# Patient Record
Sex: Female | Born: 1955 | Race: Black or African American | Hispanic: No | Smoking: Never smoker
Health system: Southern US, Community
[De-identification: ages and names within clinical notes are randomized; demographics above are authoritative.]

## PROBLEM LIST (undated history)

## (undated) DIAGNOSIS — E785 Hyperlipidemia, unspecified: Secondary | ICD-10-CM

## (undated) DIAGNOSIS — T7840XA Allergy, unspecified, initial encounter: Secondary | ICD-10-CM

## (undated) DIAGNOSIS — E538 Deficiency of other specified B group vitamins: Secondary | ICD-10-CM

## (undated) DIAGNOSIS — E119 Type 2 diabetes mellitus without complications: Secondary | ICD-10-CM

## (undated) DIAGNOSIS — I10 Essential (primary) hypertension: Secondary | ICD-10-CM

## (undated) HISTORY — DX: Essential (primary) hypertension: I10

## (undated) HISTORY — DX: Deficiency of other specified B group vitamins: E53.8

## (undated) HISTORY — DX: Hyperlipidemia, unspecified: E78.5

## (undated) HISTORY — DX: Type 2 diabetes mellitus without complications: E11.9

## (undated) HISTORY — DX: Allergy, unspecified, initial encounter: T78.40XA

---

## 1983-01-30 HISTORY — PX: FOOT SURGERY: SHX648

## 2004-03-06 ENCOUNTER — Emergency Department: Payer: Self-pay | Admitting: General Practice

## 2004-03-07 ENCOUNTER — Emergency Department: Payer: Self-pay | Admitting: General Practice

## 2004-03-08 ENCOUNTER — Inpatient Hospital Stay: Payer: Self-pay | Admitting: Surgery

## 2006-03-01 ENCOUNTER — Emergency Department: Payer: Self-pay | Admitting: Emergency Medicine

## 2011-08-22 ENCOUNTER — Other Ambulatory Visit: Payer: Self-pay | Admitting: General Practice

## 2011-08-22 LAB — BASIC METABOLIC PANEL
Calcium, Total: 9.4 mg/dL (ref 8.5–10.1)
Chloride: 105 mmol/L (ref 98–107)
Co2: 28 mmol/L (ref 21–32)
EGFR (Non-African Amer.): 54 — ABNORMAL LOW
Osmolality: 283 (ref 275–301)
Potassium: 3.7 mmol/L (ref 3.5–5.1)
Sodium: 141 mmol/L (ref 136–145)

## 2012-07-25 ENCOUNTER — Other Ambulatory Visit: Payer: Self-pay | Admitting: Physician Assistant

## 2012-07-25 LAB — COMPREHENSIVE METABOLIC PANEL
Alkaline Phosphatase: 62 U/L (ref 50–136)
Anion Gap: 4 — ABNORMAL LOW (ref 7–16)
BUN: 19 mg/dL — ABNORMAL HIGH (ref 7–18)
Calcium, Total: 9.2 mg/dL (ref 8.5–10.1)
Chloride: 103 mmol/L (ref 98–107)
Creatinine: 1.03 mg/dL (ref 0.60–1.30)
EGFR (Non-African Amer.): >60
Osmolality: 281 (ref 275–301)
SGPT (ALT): 18 U/L (ref 12–78)
Sodium: 139 mmol/L (ref 136–145)

## 2012-07-25 LAB — LIPID PANEL
HDL Cholesterol: 43 mg/dL (ref 40–60)
Triglycerides: 90 mg/dL (ref 0–200)
VLDL Cholesterol, Calc: 18 mg/dL (ref 5–40)

## 2012-08-22 ENCOUNTER — Ambulatory Visit: Payer: Self-pay | Admitting: Physician Assistant

## 2012-08-29 ENCOUNTER — Ambulatory Visit: Payer: Self-pay | Admitting: Physician Assistant

## 2012-09-29 ENCOUNTER — Ambulatory Visit: Payer: Self-pay | Admitting: Physician Assistant

## 2012-12-12 ENCOUNTER — Other Ambulatory Visit: Payer: Self-pay | Admitting: Family Medicine

## 2012-12-12 LAB — BASIC METABOLIC PANEL
Anion Gap: 1 — ABNORMAL LOW (ref 7–16)
Calcium, Total: 9.4 mg/dL (ref 8.5–10.1)
Chloride: 104 mmol/L (ref 98–107)
EGFR (African American): >60
EGFR (Non-African Amer.): 58 — ABNORMAL LOW
Glucose: 101 mg/dL — ABNORMAL HIGH (ref 65–99)
Osmolality: 276 (ref 275–301)
Potassium: 3.6 mmol/L (ref 3.5–5.1)

## 2012-12-12 LAB — LIPID PANEL
Cholesterol: 175 mg/dL (ref 0–200)
HDL Cholesterol: 40 mg/dL (ref 40–60)
Triglycerides: 104 mg/dL (ref 0–200)
VLDL Cholesterol, Calc: 21 mg/dL (ref 5–40)

## 2012-12-12 LAB — TSH: Thyroid Stimulating Horm: 1.5 u[IU]/mL

## 2012-12-16 ENCOUNTER — Ambulatory Visit: Payer: Self-pay | Admitting: Family Medicine

## 2013-02-02 ENCOUNTER — Ambulatory Visit: Payer: Self-pay | Admitting: Gastroenterology

## 2013-10-28 DIAGNOSIS — I10 Essential (primary) hypertension: Secondary | ICD-10-CM | POA: Insufficient documentation

## 2013-12-02 DIAGNOSIS — D519 Vitamin B12 deficiency anemia, unspecified: Secondary | ICD-10-CM | POA: Insufficient documentation

## 2014-03-04 ENCOUNTER — Ambulatory Visit: Payer: Self-pay | Admitting: Internal Medicine

## 2014-05-03 ENCOUNTER — Other Ambulatory Visit: Payer: Self-pay

## 2014-05-03 NOTE — Patient Outreach (Signed)
[facility](THN) Care Management  [facility]  05/03/2014   Elizabeth RombergCynthia C Bates 12-29-1955 [id number]  Subjective: Patient has no complaints. She tells me she is checking sugars most days of the week. Saw Dr. Thedore MinsSingh in December and March- states MD is pleased with her blood sugars. She tells me that the MD said that she can eat bananas for potassium as opposed to taking a potassium pill.   Objective: Patient brought her meter and is checking many days per week but always only fasting.    Current Medications:  Current Outpatient Prescriptions  Medication Sig Dispense Refill  . lisinopril-hydrochlorothiazide (PRINZIDE,ZESTORETIC) 20-12.5 MG per tablet Take 2 tablets by mouth daily.    Marland Kitchen. lovastatin (MEVACOR) 10 MG tablet Take 10 mg by mouth at bedtime.    . metFORMIN (GLUCOPHAGE) 500 MG tablet Take 500 mg by mouth daily.    . Travoprost, BAK Free, (TRAVATAN) 0.004 % SOLN ophthalmic solution Place 1 drop into both eyes at bedtime.    . vitamin B-12 (CYANOCOBALAMIN) 1000 MCG tablet Take 1,000 mcg by mouth daily.     No current facility-administered medications for this visit.     Fall/Depression Screening: PHQ 2/9 Scores 05/03/2014  PHQ - 2 Score 0    Assessment: BP 130/72, blood sugars less than 110mg /dl- no post prandial blood sugar numbers in her meter.   Plan: I will follow up with Aram Beechamynthia in 3 months- to call before then if she has any questions. Discussed the need for exercise but she was not willing to being/commit to regular exercise.  She only saw the dentist once this past year and that was to have her teeth pulled- I'd like her to see the MD 2x/year for cleaning since she does pay for this health benefit.   Susette RacerJulie Christinea Brizuela, RN, BA, MHA, CDE Diabetes Coordinator Inpatient Diabetes Program  [phone] (Team Pager) [phone] Patrcia Dolly([place] Office) 05/03/2014 10:34 AM

## 2014-08-16 ENCOUNTER — Other Ambulatory Visit: Payer: Self-pay

## 2014-08-16 NOTE — Patient Outreach (Signed)
[facility](THN) Care Management  [facility]  08/16/2014   Donneta RombergCynthia C Kohlmeyer [birth year] [id number]  Subjective:Patient in for her regular follow up- blood sugars ideal and patient reports she has not had any hypoglycemia.   Objective:  Filed Vitals:   08/16/14 1402  BP: 112/52   Blood sugar average 110mg /dl over the past 7 & 14 days, 105mg /dl over the past 30 days.  Checks every 2 days. Weight 164.4lbs.   Current Medications:  Current Outpatient Prescriptions  Medication Sig Dispense Refill  . lisinopril-hydrochlorothiazide (PRINZIDE,ZESTORETIC) 20-12.5 MG per tablet Take 2 tablets by mouth daily.    Marland Kitchen. lovastatin (MEVACOR) 10 MG tablet Take 10 mg by mouth at bedtime.    . metFORMIN (GLUCOPHAGE) 500 MG tablet Take 500 mg by mouth daily.    . Travoprost, BAK Free, (TRAVATAN) 0.004 % SOLN ophthalmic solution Place 1 drop into both eyes at bedtime.    . vitamin B-12 (CYANOCOBALAMIN) 1000 MCG tablet Take 1,000 mcg by mouth daily.     No current facility-administered medications for this visit.    Functional Status:  In your present state of health, do you have any difficulty performing the following activities: 08/16/2014  Hearing? N  Vision? N  Difficulty concentrating or making decisions? N  Walking or climbing stairs? N  Dressing or bathing? N  Doing errands, shopping? N    Fall/Depression Screening: PHQ 2/9 Scores 08/16/2014 05/03/2014  PHQ - 2 Score 0 0    Assessment: Well managed diabetes- engaged with her care.    Plan: To improve overall health patient plans to engage in an exercise program;walking 15 minutes per day, 5 x/week.  F/U with [employer] [facility] in December 2016.    Susette RacerJulie Kwaku Mostafa, RN, BA, MHA, CDE [facility] Diabetes Coordinator- [facility] [employer] Dial:  [phone]  Fax:  [phone] E-mail: [email or web address] [address], [place], [place]  [id number]

## 2014-10-21 DIAGNOSIS — E119 Type 2 diabetes mellitus without complications: Secondary | ICD-10-CM | POA: Insufficient documentation

## 2014-10-21 DIAGNOSIS — E7800 Pure hypercholesterolemia, unspecified: Secondary | ICD-10-CM | POA: Insufficient documentation

## 2014-10-21 DIAGNOSIS — E538 Deficiency of other specified B group vitamins: Secondary | ICD-10-CM | POA: Insufficient documentation

## 2014-11-15 ENCOUNTER — Other Ambulatory Visit: Payer: Self-pay

## 2014-11-15 NOTE — Patient Outreach (Signed)
[facility](THN) Care Management  11/15/2014  Elizabeth RombergCynthia C Bates [birth year] [id number]   Left message for patient.  She will be transferred to the care of our pharmacy team for diabetes management, [facility]. Encouraged to call me if she has questions.   Susette RacerJulie Loistine Eberlin, RN, BA, MHA, CDE [facility] Diabetes Coordinator- [facility] [employer] [employer] Dial:  [phone]  Fax:  [phone] E-mail: [email or web address] [address], [place], [place]  [id number]

## 2014-11-15 NOTE — Patient Outreach (Signed)
[facility](THN) Care Management  11/15/2014  Donneta RombergCynthia C Pooley 01-Jan-1956 [id number]   Aram BeechamCynthia called enquiring about her next visit.      Susette RacerJulie Sinead Hockman, RN, BA, MHA, CDE [facility] Diabetes Coordinator- [facility] [employer] Direct Dial:  [phone]  Fax:  [phone] E-mail: [email or web address] [address], [place], [place]  [id number]

## 2014-11-24 ENCOUNTER — Telehealth: Payer: Self-pay

## 2015-01-10 ENCOUNTER — Other Ambulatory Visit: Payer: Self-pay | Admitting: Pharmacist

## 2015-01-10 ENCOUNTER — Encounter: Payer: Self-pay | Admitting: Pharmacist

## 2015-01-10 NOTE — Patient Outreach (Signed)
[facility]) Care Management  [facility] CM Pharmacy   01/10/2015  Elizabeth Bates [birth year] [id number]  Subjective: Patient presents today for 3 month diabetes follow-up as part of the employer-sponsored [employer] [facility] Well[employer] program.  Current diabetes regimen includes metformin  daily.  Patient also continues on daily ACE Inhibitor and statin.  Patient reports compliance with her medications.  Most recent MD follow-up was in September 2016.   No med changes or major health changes at this time.  Patient reports she is not currently exercising or following a diet right now, but she is monitoring her portion sizes.  Her most recent A1c was 6.6% (04/14/14).  Patient has a pending appt for 01/17/15.  Patient is currently monitoring blood glucose 2 to 3 times weekly with 14-day blood glucose average of /dL per her glucometer.  Patient denies hypoglycemia.    Objective:  Blood pressure: 133/84   Current Medications: Current Outpatient Prescriptions  Medication Sig Dispense Refill  . lisinopril-hydrochlorothiazide (PRINZIDE,ZESTORETIC) 20-12.5 MG per tablet Take 2 tablets by mouth daily.    Marland Kitchen lovastatin (MEVACOR) 10 MG tablet Take 10 mg by mouth at bedtime.    . metFORMIN (GLUCOPHAGE) 500 MG tablet Take 500 mg by mouth daily.    . Travoprost, BAK Free, (TRAVATAN) 0.004 % SOLN ophthalmic solution Place 1 drop into both eyes at bedtime.    . vitamin B-12 (CYANOCOBALAMIN) 1000 MCG tablet Take 1,000 mcg by mouth daily.     No current facility-administered medications for this visit.    Functional Status: In your present state of health, do you have any difficulty performing the following activities: 08/16/2014  Hearing? N  Vision? N  Difficulty concentrating or making decisions? N  Walking or climbing stairs? N  Dressing or bathing? N  Doing errands, shopping? N    Fall/Depression Screening: PHQ 2/9 Scores 08/16/2014 05/03/2014  PHQ - 2 Score 0 0    Assessment: 1.   Diabetes: Most recent A1C was 6.6% which is at goal of less than 7%.  Patient denies hypoglycemic events and is able to verbalize appropriate hypoglycemia management plan.  Patient appears motivated to increase exercise at this time.    Patient's estimated 10-year ASCVD risk is 13.9%.  Patient is currently on low-intensity statin therapy.  Per 2013 ACC/AHA cholesterol guidelines, high intensity statin therapy is indicated.  Aspirin therapy also indicated per 2016 ADA diabetes guidelines due to ASCVD >10%.    2.  Hypertension:  Blood pressure at goal of less than 140/90.    Plan: 1.  Patient will continue to take her medications as prescribed and will continue to monitor blood glucose 2 to 3 times per week.  2.  She will increase exercise to 15 minutes per day three days a week.  3.  Will send Dr. Thedore Mins a fax regarding recommendation for high intensity statin therapy and aspirin.   4.  Pharmacy follow up scheduled for 04/11/15 at 10:00 AM.    [facility] CM Care Plan Problem One        Most Recent Value   Care Plan Problem One  Diabetes Management   Role Documenting the Problem One  Clinical Pharmacist   Care Plan for Problem One  Active   THN Long Term Goal (31-90 days)  Patient will work to start walking three days per week for 15 minutes per day in the next 90 days per patient report. t   THN Long Term Goal Start Date  01/10/15   Interventions for  Problem One Long Term Goal  Discussed importance of exercise in diabetes management.  Encouraged patient to begin exercising and work toward 150 minutes per week.  Patient will start by walking 3 days per week for 15 minutes per day.       Lilla Shookachel Henderson, Pharm.D. Pharmacy Resident [facility] [phone]

## 2015-03-02 DIAGNOSIS — H401132 Primary open-angle glaucoma, bilateral, moderate stage: Secondary | ICD-10-CM | POA: Diagnosis not present

## 2015-04-11 ENCOUNTER — Encounter: Payer: Self-pay | Admitting: Pharmacist

## 2015-04-11 ENCOUNTER — Other Ambulatory Visit: Payer: Self-pay | Admitting: Pharmacist

## 2015-04-11 NOTE — Patient Outreach (Signed)
[facility](THN) Care Management  [facility]   04/11/2015  Donneta RombergCynthia C Kuehnel Oct 25, 1955 [id number]  Subjective: Patient presents today for 3 month diabetes follow-up as part of the employer-sponsored Link to Wellness program.  Current diabetes regimen includes metformin 500 mg daily.  Patient also continues on daily ACE Inhibitor and statin.  Patient reports adherence with her medications.  Most recent MD follow-up was 01/17/15.  Patient has a pending appt for 04/18/15.  No med changes or major health changes at this time.  Patient reports she is not currently exercising or following a diet right now.  Her most recent A1c was 6.8% (01/17/15).  Patient is currently monitoring her blood glucose every other day with 7-day average of 112 mg/d, 14-day average of 114 mg/dL, and 40-JWJ30-day average of 121 mg/dL.  Patient denies hypoglycemia.    Objective:  Blood pressure = 125/71  Current Medications: Current Outpatient Prescriptions  Medication Sig Dispense Refill  . lisinopril-hydrochlorothiazide (PRINZIDE,ZESTORETIC) 20-12.5 MG per tablet Take 2 tablets by mouth daily.    Marland Kitchen. lovastatin (MEVACOR) 10 MG tablet Take 10 mg by mouth at bedtime.    . metFORMIN (GLUCOPHAGE) 500 MG tablet Take 500 mg by mouth daily.    . Travoprost, BAK Free, (TRAVATAN) 0.004 % SOLN ophthalmic solution Place 1 drop into both eyes at bedtime.    . vitamin B-12 (CYANOCOBALAMIN) 1000 MCG tablet Take 1,000 mcg by mouth daily.     No current facility-administered medications for this visit.   Functional Status: In your present state of health, do you have any difficulty performing the following activities: 04/11/2015 08/16/2014  Hearing? N N  Vision? N N  Difficulty concentrating or making decisions? N N  Walking or climbing stairs? N N  Dressing or bathing? N N  Doing errands, shopping? N N   Fall/Depression Screening: PHQ 2/9 Scores 04/11/2015 08/16/2014 05/03/2014  PHQ - 2 Score 0 0 0   Assessment: 1.   Diabetes: Most recent A1C was 6.8% which is at goal of less than 7%. Weight is stable from last visit with me.  Patient denies hypoglycemic events and is able to verbalize appropriate hypoglycemia management plan.  Reinforced importance of diet and exercise in diabetes management.  Provided patient EMMI handouts on "Type 2 Diabetes: Food and Exercise" and "Diabetes Diet - Type 2."    Patient's estimated 10-year ASCVD risk is 12%.  Patient is currently on low-intensity statin therapy.  Per ACC/AHA cholesterol guidelines, high intensity statin therapy is indicated.  Aspirin therapy is also indicated due to ASCVD > 10%; however, patient reports she was instructed not to take aspirin due to her anemia.   2.  Hypertension:  Blood pressure at goal of less than 140/90.     Plan: 1.  Patient to review EMMI materials: "Type 2 Diabetes: Food and Exercise" and "Diabetes Diet - Type 2." 2.  Patient set goal to increase exercise to 15 minutes per day three days per week.   3.  Will send quarterly update to patient's PCP.  Will recommend changing lovastatin to high intensity statin therapy such as atorvastatin 40 or 80 mg daily.   4.  Follow up visit scheduled for 07/11/15 at 10:00 AM.    [facility] Problem [facility]        Most Recent Value   Care Plan Problem One  Diabetes Management   Role Documenting the Problem One  Clinical Pharmacist   Care Plan for Problem One  Active  THN Long Term Goal (31-90 days)  Patient will work to start walking three days per week for 15 minutes per day in the next 90 days per patient report. t   THN Long Term Goal Start Date  01/10/15 [renwed goal ]   Interventions for Problem One Long Term Goal  Discussed importance of exercise in diabetes management.  Encouraged patient to begin exercising and work toward 150 minutes per week.  Patient will start by walking 3 days per week for 15 minutes per day.      Lilla Shook, Pharm.D. Pharmacy Resident [facility] [employer] [phone]

## 2015-04-18 ENCOUNTER — Other Ambulatory Visit: Payer: Self-pay | Admitting: Internal Medicine

## 2015-04-18 DIAGNOSIS — E119 Type 2 diabetes mellitus without complications: Secondary | ICD-10-CM | POA: Diagnosis not present

## 2015-04-18 DIAGNOSIS — Z23 Encounter for immunization: Secondary | ICD-10-CM | POA: Diagnosis not present

## 2015-04-18 DIAGNOSIS — Z0000 Encounter for general adult medical examination without abnormal findings: Secondary | ICD-10-CM | POA: Diagnosis not present

## 2015-04-18 DIAGNOSIS — Z1239 Encounter for other screening for malignant neoplasm of breast: Secondary | ICD-10-CM | POA: Diagnosis not present

## 2015-04-18 DIAGNOSIS — Z124 Encounter for screening for malignant neoplasm of cervix: Secondary | ICD-10-CM | POA: Diagnosis not present

## 2015-05-02 ENCOUNTER — Ambulatory Visit
Admission: RE | Admit: 2015-05-02 | Discharge: 2015-05-02 | Disposition: A | Discharge status: Home / Self Care | Payer: 59 | Source: Ambulatory Visit | Attending: Internal Medicine | Admitting: Internal Medicine

## 2015-05-02 ENCOUNTER — Other Ambulatory Visit: Payer: Self-pay | Admitting: Internal Medicine

## 2015-05-02 DIAGNOSIS — Z1239 Encounter for other screening for malignant neoplasm of breast: Secondary | ICD-10-CM

## 2015-05-02 DIAGNOSIS — Z1231 Encounter for screening mammogram for malignant neoplasm of breast: Secondary | ICD-10-CM | POA: Diagnosis not present

## 2015-07-04 DIAGNOSIS — H524 Presbyopia: Secondary | ICD-10-CM | POA: Diagnosis not present

## 2015-07-11 ENCOUNTER — Encounter: Payer: Self-pay | Admitting: Pharmacist

## 2015-07-11 ENCOUNTER — Other Ambulatory Visit: Payer: Self-pay | Admitting: Pharmacist

## 2015-07-11 NOTE — Patient Outreach (Signed)
[facility]) Care Management  [facility] CM Pharmacy   07/11/2015  SHATERA RENNERT [birth year] [id number]  Subjective: Patient presents today for 3 month diabetes follow-up as part of the employer-sponsored Link to Wellness program.  Current diabetes regimen includes metformin 500 mg daily.  Patient also continues on daily ACE Inhibitor and statin.  Patient reports adherence with her medications.    Most recent MD follow-up was 04/18/15.  Patient has a pending appt for 07/25/15.  No med changes or major health changes at this time.  Patient reports she saw her eye doctor on 07/04/15 and reports having a good exam.  She states she has not seen a dentist in the past 6 months.  She reports she had to cancel her appointment in May 2017.    Patient reports she is not currently exercising or following a diet right now.  Patient reports having a shoulder injury due to heavy lifting.  Patient reports pain in her shoulder has limited exercise.  Patient reports the pain in her shoulder improved after taking meloxicam for pain (prescribed by PCP), using Aspercreme, and resting.  Patient reports she is no longer having to take meloxicam.  She states she is working on watching what she eats.  She reports she eats less during the summer when it is hot outside.    Her most recent A1c was 6.8% on 01/17/15.  Patient is currently monitoring her blood glucose daily with blood glucose of 113 mg/dL today.  Per review of patient's meter, most blood glucose readings are in the low 100s.  Patient denies hypoglycemia.    Objective:  Blood pressure = 109/72 Weight = 158.5 lb  Encounter Medications: Outpatient Encounter Prescriptions as of 07/11/2015  Medication Sig  . lisinopril-hydrochlorothiazide (PRINZIDE,ZESTORETIC) 20-12.5 MG per tablet Take 2 tablets by mouth daily.  Marland Kitchen lovastatin (MEVACOR) 10 MG tablet Take 10 mg by mouth at bedtime.  . metFORMIN (GLUCOPHAGE) 500 MG tablet Take 500 mg by mouth daily.  .  Travoprost, BAK Free, (TRAVATAN) 0.004 % SOLN ophthalmic solution Place 1 drop into both eyes at bedtime.  . vitamin B-12 (CYANOCOBALAMIN) 1000 MCG tablet Take 1,000 mcg by mouth daily.   No facility-administered encounter medications on file as of 07/11/2015.   Functional Status: In your present state of health, do you have any difficulty performing the following activities: 04/11/2015 08/16/2014  Hearing? N N  Vision? N N  Difficulty concentrating or making decisions? N N  Walking or climbing stairs? N N  Dressing or bathing? N N  Doing errands, shopping? N N   Fall/Depression Screening: PHQ 2/9 Scores 04/11/2015 08/16/2014 05/03/2014  PHQ - 2 Score 0 0 0    Assessment: 1.  Diabetes:  Most recent A1c was 6.8% which is at goal of less than 7%.  Weight is decreased from last visit with me.  Patient denies hypoglycemic events and is able to verbalize appropriate hypoglycemia management plan.  Patient is not currently exercising, but is interested in increasing activity.    2.  Hypertension:  Blood pressure at goal of less than 140/90.    Plan: 1.  Patient set goal to increase exercise to 15 minutes per day for three days per week.   2.  Patient set goal to see a dentist in the next 3 months.   3.  Follow up visit scheduled for 10/17/15.     [facility] CM Care Plan Problem One        Most Recent Value  Care Plan Problem One  Diabetes Management   Role Documenting the Problem One  Clinical Pharmacist   Care Plan for Problem One  Active   [facility]N Long Term Goal (31-90 days)  Patient will work to start walking three days per week for 15 minutes per day in the next 90 days per patient report. t   THN Long Term Goal Start Date  07/11/15 [renewed goal]   Interventions for Problem One Long Term Goal  Patient reports she has not been exercising.  States she had shoulder injury and exercise was limited due to pain from shoulder injury.  Patient reports she wants to continue to work to increase exercise.   Again reviewed the importance of exercise in diabetes management.  Encouraged patient to begin exercising and work toward 150 minutes per week.  Provided patient handouts at last visit about "Type 2 Diabetes: Food and Exercise."  Discussed handouts during this visit.  Patient reports she read handouts and denied any questions.      [facility] Problem Two        Most Recent Value   Care Plan Problem Two  Wellness   Role Documenting the Problem Two  Clinical Pharmacist   Care Plan for Problem Two  Active   Interventions for Problem Two Long Term Goal   Patient has not seen a dentist in the past 6 months.  Patient reports she had an appointment scheduled for May 2017,  however, she had to cancel appointment.  Discussed importance of seeing a dentist every 6 months.     THN Long Term Goal (31-90) days  Patient will schedule a dentist appointment within the next 3 months per patient report.    THN Long Term Goal Start Date  07/11/15     Lilla Shookachel Sky Primo, Pharm.D. Pharmacy Resident [facility] [phone]

## 2015-07-16 ENCOUNTER — Encounter: Payer: Self-pay | Admitting: Emergency Medicine

## 2015-07-16 ENCOUNTER — Emergency Department: Payer: PRIVATE HEALTH INSURANCE

## 2015-07-16 ENCOUNTER — Emergency Department
Admission: EM | Admit: 2015-07-16 | Discharge: 2015-07-16 | Disposition: A | Discharge status: Home / Self Care | Payer: PRIVATE HEALTH INSURANCE | Attending: Emergency Medicine | Admitting: Emergency Medicine

## 2015-07-16 DIAGNOSIS — E785 Hyperlipidemia, unspecified: Secondary | ICD-10-CM | POA: Diagnosis not present

## 2015-07-16 DIAGNOSIS — M19012 Primary osteoarthritis, left shoulder: Secondary | ICD-10-CM | POA: Diagnosis not present

## 2015-07-16 DIAGNOSIS — Y939 Activity, unspecified: Secondary | ICD-10-CM | POA: Diagnosis not present

## 2015-07-16 DIAGNOSIS — Y999 Unspecified external cause status: Secondary | ICD-10-CM | POA: Diagnosis not present

## 2015-07-16 DIAGNOSIS — S4992XA Unspecified injury of left shoulder and upper arm, initial encounter: Secondary | ICD-10-CM | POA: Diagnosis present

## 2015-07-16 DIAGNOSIS — W010XXA Fall on same level from slipping, tripping and stumbling without subsequent striking against object, initial encounter: Secondary | ICD-10-CM | POA: Insufficient documentation

## 2015-07-16 DIAGNOSIS — E119 Type 2 diabetes mellitus without complications: Secondary | ICD-10-CM | POA: Diagnosis not present

## 2015-07-16 DIAGNOSIS — Y929 Unspecified place or not applicable: Secondary | ICD-10-CM | POA: Diagnosis not present

## 2015-07-16 DIAGNOSIS — Z7984 Long term (current) use of oral hypoglycemic drugs: Secondary | ICD-10-CM | POA: Diagnosis not present

## 2015-07-16 DIAGNOSIS — S40022A Contusion of left upper arm, initial encounter: Secondary | ICD-10-CM | POA: Insufficient documentation

## 2015-07-16 DIAGNOSIS — I10 Essential (primary) hypertension: Secondary | ICD-10-CM | POA: Insufficient documentation

## 2015-07-16 MED ORDER — NAPROXEN 500 MG PO TABS: 500.0000 mg | ORAL_TABLET | Freq: Two times a day (BID) | ORAL | Status: DC

## 2015-07-16 NOTE — Discharge Instructions (Signed)
Contusion °A contusion is a deep bruise. Contusions happen when an injury causes bleeding under the skin. Symptoms of bruising include pain, swelling, and discolored skin. The skin may turn blue, purple, or yellow. °HOME CARE  °· Rest the injured area. °· If told, put ice on the injured area. °· Put ice in a plastic bag. °· Place a towel between your skin and the bag. °· Leave the ice on for 20 minutes, 2-3 times per day. °· If told, put light pressure (compression) on the injured area using an elastic bandage. Make sure the bandage is not too tight. Remove it and put it back on as told by your doctor. °· If possible, raise (elevate) the injured area above the level of your heart while you are sitting or lying down. °· Take over-the-counter and prescription medicines only as told by your doctor. °GET HELP IF: °· Your symptoms do not get better after several days of treatment. °· Your symptoms get worse. °· You have trouble moving the injured area. °GET HELP RIGHT AWAY IF:  °· You have very bad pain. °· You have a loss of feeling (numbness) in a hand or foot. °· Your hand or foot turns pale or cold. °  °This information is not intended to replace advice given to you by your health care provider. Make sure you discuss any questions you have with your health care provider. °  °Document Released: 07/04/2007 Document Revised: 10/06/2014 Document Reviewed: 06/02/2014 °Elsevier Interactive Patient Education ©2016 [employer] Inc[employer] ° °Cryotherapy °Cryotherapy is when you put ice on your injury. Ice helps lessen pain and puffiness (swelling) after an injury. Ice works the best when you start using it in the first 24 to 48 hours after an injury. °HOME CARE °· Put a dry or damp towel between the ice pack and your skin. °· You may press gently on the ice pack. °· Leave the ice on for no more than 10 to 20 minutes at a time. °· Check your skin after 5 minutes to make sure your skin is okay. °· Rest at least 20 minutes between ice  pack uses. °· Stop using ice when your skin loses feeling (numbness). °· Do not use ice on someone who cannot tell you when it hurts. This includes small children and people with memory problems (dementia). °GET HELP RIGHT AWAY IF: °· You have white spots on your skin. °· Your skin turns blue or pale. °· Your skin feels waxy or hard. °· Your puffiness gets worse. °MAKE SURE YOU:  °· Understand these instructions. °· Will watch your condition. °· Will get help right away if you are not doing well or get worse. °  °This information is not intended to replace advice given to you by your health care provider. Make sure you discuss any questions you have with your health care provider. °  °Document Released: 07/04/2007 Document Revised: 04/09/2011 Document Reviewed: 09/07/2010 °Elsevier Interactive Patient Education ©2016 Elsevier Inc. ° °

## 2015-07-16 NOTE — ED Provider Notes (Signed)
[facility] Emergency Department Provider Note  ____________________________________________  Time seen: Approximately 2:02 PM  I have reviewed the triage vital signs and the nursing notes.   HISTORY  Chief Complaint Shoulder Pain    HPI Elizabeth Bates is a 60 y.o. female , NAD, presents to the emergency department accompanied by her husband who assists with history. Patient states she's tripped on a carpet and fell on her left upper arm and shoulder. Has pain about the left upper arm and shoulder since the incident. Denies any numbness, weakness, tingling. No previous traumas to this area of her body. Denies any head injury, LOC, dizziness.   Past Medical History  Diagnosis Date  . Allergy   . Diabetes mellitus without complication (HCC)   . Hypertension   . Hyperlipidemia   . Vitamin B12 deficiency     There are no active problems to display for this patient.   Past Surgical History  Procedure Laterality Date  . 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 lisinopril-hydrochlorothiazide (PRINZIDE,ZESTORETIC) 20-12.5 MG per tablet   Oral   Take 2 tablets by mouth daily.         Marland Kitchen lovastatin (MEVACOR) 10 MG tablet   Oral   Take 10 mg by mouth at bedtime.         . metFORMIN (GLUCOPHAGE) 500 MG tablet   Oral   Take 500 mg by mouth daily.         . naproxen (NAPROSYN) 500 MG tablet   Oral   Take 1 tablet (500 mg total) by mouth 2 (two) times daily with a meal.   14 tablet   0   . Travoprost, BAK Free, (TRAVATAN) 0.004 % SOLN ophthalmic solution   Both Eyes   Place 1 drop into both eyes at bedtime.         . vitamin B-12 (CYANOCOBALAMIN) 1000 MCG tablet   Oral   Take 1,000 mcg by mouth daily.           Allergies Review of patient's allergies indicates no known allergies.  Family History  Problem Relation Age of Onset  .  Cancer Mother   . Hypertension Mother   . Cancer Father   . Hypertension Father   . Cancer Maternal Aunt   . Diabetes Maternal Aunt   . Heart disease Maternal Aunt   . Breast cancer Maternal Aunt   . Cancer Maternal Uncle     Social History Social History  Substance Use Topics  . Smoking status: Never Smoker   . Smokeless tobacco: Never Used  . Alcohol Use: No     Review of Systems  Constitutional: No fever/chills Eyes: No visual changes.  Cardiovascular: No chest pain. Respiratory:  No shortness of breath.  Musculoskeletal: Positive left shoulder and upper arm pain. Negative for back, neck pain.  Skin: Negative for rash, bruising, swelling, redness, open wounds or lacerations. Neurological: Negative for headaches, focal weakness or numbness. No tingling 10-point ROS otherwise negative.  ____________________________________________   PHYSICAL EXAM:  VITAL SIGNS: ED Triage Vitals  Enc Vitals Group     BP 07/16/15 1242 128/64 mmHg     Pulse Rate 07/16/15 1242 79     Resp 07/16/15 1242 18     Temp 07/16/15 1242 97.7 F (36.5 C)     Temp Source  07/16/15 1242 Oral     SpO2 07/16/15 1242 100 %     Weight 07/16/15 1242 158 lb (71.668 kg)     Height 07/16/15 1242 5\' 1"  (1.549 m)     Head Cir --      Peak Flow --      Pain Score 07/16/15 1243 10     Pain Loc --      Pain Edu? --      Excl. in GC? --      Constitutional: Alert and oriented. Well appearing and in no acute distress. Eyes: Conjunctivae are normal.  Head: Atraumatic. Neck: No cervical spine tenderness to palpation. Supple with full range of motion. No trapezial muscle spasms appreciated. Hematological/Lymphatic/Immunilogical: No cervical lymphadenopathy. Cardiovascular:   Good peripheral circulation with 2+ pulses noted in bilateral upper extremities. Capillary refill is brisk in the left upper extremity. Respiratory: Normal respiratory effort without tachypnea or retractions.  Musculoskeletal: Mild  tenderness to palpation about the left deltoid region. No glenohumeral tenderness to palpation. No AC joint tenderness to palpation. Patient can extend at the left shoulder to approximately 80 and stops due to pain in the upper arm. Full range of motion of the bilateral elbows, wrists, hands, fingers without pain.  Neurologic:  Normal speech and language. No gross focal neurologic deficits are appreciated. Sensation to light touch about the left upper extremity is grossly normal. Skin:  Skin is warm, dry and intact. No rash, redness, swelling, bruising, open wounds or lacerations noted about the neck, shoulders, upper back, left upper extremity. Psychiatric: Mood and affect are normal. Speech and behavior are normal. Patient exhibits appropriate insight and judgement.   ____________________________________________   LABS  None ____________________________________________  EKG  None ____________________________________________  RADIOLOGY I have personally viewed and evaluated these images (plain radiographs) as part of my medical decision making, as well as reviewing the written report by the radiologist.  Dg Shoulder Left  07/16/2015  CLINICAL DATA:  60 year old female with a history of left shoulder injury. EXAM: LEFT SHOULDER - 2+ VIEW COMPARISON:  None. FINDINGS: No acute fracture. Glenohumeral joint appears congruent. Degenerative changes at the glenohumeral joint, greater tuberosity, and acromioclavicular joint. Subacromial spurring. No rib fracture identified. IMPRESSION: Negative for acute bony abnormality. Degenerative changes as above. Signed, Yvone Neu. Loreta Ave, DO [facility] Radiology Electronically Signed   By: Gilmer Mor D.O.   On: 07/16/2015 13:27    ____________________________________________    PROCEDURES  Procedure(s) performed: None   Medications - No data to  display   ____________________________________________   INITIAL IMPRESSION / ASSESSMENT AND PLAN / ED COURSE  Pertinent imaging results that were available during my care of the patient were reviewed by me and considered in my medical decision making (see chart for details).  Patient's diagnosis is consistent with contusion of left upper arm. Patient will be discharged home with prescriptions for naproxen to take as directed. Patient may also take Tylenol alternating with the prescribed naproxen as needed. Advised patient to apply ice to the affected area 20 minutes 3-4 times daily as needed. Encouraged range of motion exercises to improve extension of the left upper extremity. Patient is to follow up with Dr. Joice Lofts in orthopedics or other orthopedic specialist as deemed appropriate by Work[employer] Compensation if symptoms persist past this treatment course. Patient is given ED precautions to return to the ED for any worsening or new symptoms.    ____________________________________________  FINAL CLINICAL IMPRESSION(S) / ED DIAGNOSES  Final  diagnoses:  Contusion of left upper arm, initial encounter      NEW MEDICATIONS STARTED DURING THIS VISIT:  Discharge Medication List as of 07/16/2015  2:07 PM    START taking these medications   Details  naproxen (NAPROSYN) 500 MG tablet Take 1 tablet (500 mg total) by mouth 2 (two) times daily with a meal., Starting 07/16/2015, Until Discontinued, Print             Hope PigeonJami L Hagler, PA-C 07/16/15 1422  Sharyn CreamerMark Quale, MD 07/16/15 1525

## 2015-07-16 NOTE — ED Notes (Signed)
Slipped and fell on mat at work just prior to arrival, landed on L arm, pain upper arm and shoulder.

## 2015-07-18 DIAGNOSIS — E119 Type 2 diabetes mellitus without complications: Secondary | ICD-10-CM | POA: Diagnosis not present

## 2015-07-25 DIAGNOSIS — E7800 Pure hypercholesterolemia, unspecified: Secondary | ICD-10-CM | POA: Diagnosis not present

## 2015-07-25 DIAGNOSIS — I10 Essential (primary) hypertension: Secondary | ICD-10-CM | POA: Diagnosis not present

## 2015-07-25 DIAGNOSIS — M25512 Pain in left shoulder: Secondary | ICD-10-CM | POA: Diagnosis not present

## 2015-07-25 DIAGNOSIS — E119 Type 2 diabetes mellitus without complications: Secondary | ICD-10-CM | POA: Diagnosis not present

## 2015-09-01 ENCOUNTER — Encounter: Payer: Self-pay | Admitting: Urgent Care

## 2015-09-01 ENCOUNTER — Emergency Department
Admission: EM | Admit: 2015-09-01 | Discharge: 2015-09-02 | Disposition: A | Discharge status: Home / Self Care | Payer: 59 | Attending: Emergency Medicine | Admitting: Emergency Medicine

## 2015-09-01 ENCOUNTER — Emergency Department: Payer: 59

## 2015-09-01 DIAGNOSIS — Z7984 Long term (current) use of oral hypoglycemic drugs: Secondary | ICD-10-CM | POA: Insufficient documentation

## 2015-09-01 DIAGNOSIS — Z79899 Other long term (current) drug therapy: Secondary | ICD-10-CM | POA: Insufficient documentation

## 2015-09-01 DIAGNOSIS — I10 Essential (primary) hypertension: Secondary | ICD-10-CM | POA: Diagnosis not present

## 2015-09-01 DIAGNOSIS — E119 Type 2 diabetes mellitus without complications: Secondary | ICD-10-CM | POA: Insufficient documentation

## 2015-09-01 DIAGNOSIS — M7989 Other specified soft tissue disorders: Secondary | ICD-10-CM | POA: Diagnosis not present

## 2015-09-01 DIAGNOSIS — M79672 Pain in left foot: Secondary | ICD-10-CM | POA: Diagnosis not present

## 2015-09-01 NOTE — ED Triage Notes (Signed)
Patient presents with c/o LEFT foot pain and swelling x 3 weeks. 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

## 2015-09-02 DIAGNOSIS — Z79899 Other long term (current) drug therapy: Secondary | ICD-10-CM | POA: Diagnosis not present

## 2015-09-02 DIAGNOSIS — Z7984 Long term (current) use of oral hypoglycemic drugs: Secondary | ICD-10-CM | POA: Diagnosis not present

## 2015-09-02 DIAGNOSIS — M79672 Pain in left foot: Secondary | ICD-10-CM | POA: Diagnosis not present

## 2015-09-02 DIAGNOSIS — I10 Essential (primary) hypertension: Secondary | ICD-10-CM | POA: Diagnosis not present

## 2015-09-02 DIAGNOSIS — E119 Type 2 diabetes mellitus without complications: Secondary | ICD-10-CM | POA: Diagnosis not present

## 2015-09-02 MED ORDER — OXYCODONE-ACETAMINOPHEN 5-325 MG PO TABS: 1.0000 | ORAL_TABLET | ORAL | 0 refills | Status: DC | PRN

## 2015-09-02 MED ORDER — OXYCODONE-ACETAMINOPHEN 5-325 MG PO TABS
1.0000 | ORAL_TABLET | Freq: Once | ORAL | Status: AC
  Administered 2015-09-02: 1 via ORAL
  Filled 2015-09-02: qty 1

## 2015-09-02 MED ORDER — IBUPROFEN 800 MG PO TABS
800.0000 mg | ORAL_TABLET | Freq: Once | ORAL | Status: AC
  Administered 2015-09-02: 800 mg via ORAL
  Filled 2015-09-02: qty 1

## 2015-09-02 MED ORDER — IBUPROFEN 800 MG PO TABS: 800.0000 mg | ORAL_TABLET | Freq: Three times a day (TID) | ORAL | 0 refills | Status: DC | PRN

## 2015-09-02 NOTE — ED Provider Notes (Signed)
[facility] Emergency Department Provider Note   ____________________________________________   First MD Initiated Contact with Patient 09/02/15 0005     (approximate)  I have reviewed the triage vital signs and the nursing notes.   HISTORY  Chief Complaint Foot Pain    HPI Elizabeth Bates is a 60 y.o. female who presents to the ED from home with a chief complaint of left foot pain. Patient reports nontraumatic left foot pain 3 weeks. History of surgery on the same foot.States she stands on concrete floor for prolonged periods of time at her job. Swelling is worse when she gets off of work, and better first thing in the morning when she wakes up. Denies recent fever, chills, chest pain, shortness of breath, abdominal pain, nausea, vomiting, diarrhea. Denies recent travel or trauma. Has tried ibuprofen without relief of symptoms. Standing for prolonged periods of time makes her symptoms worse.   Past Medical History:  Diagnosis Date  . Allergy   . Diabetes mellitus without complication (HCC)   . Hyperlipidemia   . Hypertension   . Vitamin B12 deficiency     There are no active problems to display for this patient.   Past Surgical History:  Procedure Laterality Date  . 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 Authorizing Provider  lisinopril-hydrochlorothiazide (PRINZIDE,ZESTORETIC) 20-12.5 MG per tablet Take 2 tablets by mouth daily.    Historical Provider, MD  lovastatin (MEVACOR) 10 MG tablet Take 10 mg by mouth at bedtime.    Historical Provider, MD  metFORMIN (GLUCOPHAGE) 500 MG tablet Take 500 mg by mouth daily.    Historical Provider, MD  naproxen (NAPROSYN) 500 MG tablet Take 1 tablet (500 mg total) by mouth 2 (two) times daily with a meal. 07/16/15   Jami L Hagler, PA-C  Travoprost, BAK Free, (TRAVATAN) 0.004 % SOLN ophthalmic  solution Place 1 drop into both eyes at bedtime.    Historical Provider, MD  vitamin B-12 (CYANOCOBALAMIN) 1000 MCG tablet Take 1,000 mcg by mouth daily.    Historical Provider, MD    Allergies Review of patient's allergies indicates no known allergies.  Family History  Problem Relation Age of Onset  . Cancer Mother   . Hypertension Mother   . Cancer Father   . Hypertension Father   . Cancer Maternal Aunt   . Diabetes Maternal Aunt   . Heart disease Maternal Aunt   . Breast cancer Maternal Aunt   . Cancer Maternal Uncle     Social History Social History  Substance Use Topics  . Smoking status: Never Smoker  . Smokeless tobacco: Never Used  . Alcohol use No    Review of Systems  Constitutional: No fever/chills. Eyes: No visual changes. ENT: No sore throat. Cardiovascular: Denies chest pain. Respiratory: Denies shortness of breath. Gastrointestinal: No abdominal pain.  No nausea, no vomiting.  No diarrhea.  No constipation. Genitourinary: Negative for dysuria. Musculoskeletal: Positive for left foot pain. Negative for back pain. Skin: Negative for rash. Neurological: Negative for headaches, focal weakness or numbness.  10-point ROS otherwise negative.  ____________________________________________   PHYSICAL EXAM:  VITAL SIGNS: ED Triage Vitals  Enc Vitals Group     BP 09/01/15 2304 123/68     Pulse Rate 09/01/15 2304 89     Resp 09/01/15 2304 18     Temp 09/01/15 2304  98.3 F (36.8 C)     Temp Source 09/01/15 2304 Oral     SpO2 09/01/15 2304 97 %     Weight 09/01/15 2304 166 lb (75.3 kg)     Height 09/01/15 2304 5\' 1"  (1.549 m)     Head Circumference --      Peak Flow --      Pain Score 09/01/15 2305 10     Pain Loc --      Pain Edu? --      Excl. in GC? --     Constitutional: Alert and oriented. Well appearing and in no acute distress. Eyes: Conjunctivae are normal. PERRL. EOMI. Head: Atraumatic. Nose: No congestion/rhinnorhea. Mouth/Throat:  Mucous membranes are moist.  Oropharynx non-erythematous. Neck: No stridor.   Cardiovascular: Normal rate, regular rhythm. Grossly normal heart sounds.  Good peripheral circulation. Respiratory: Normal respiratory effort.  No retractions. Lungs CTAB. Gastrointestinal: Soft and nontender. No distention. No abdominal bruits. No CVA tenderness. Musculoskeletal: Left foot tender to palpation over anterior no talar ligament. Foot has mild swelling. Limited range of motion secondary to pain. 2+ distal pulses. Calf is supple without tenderness or swelling. Limb is symmetrically warm without evidence for ischemia. Brisk, less than 5 second capillary refill. Neurologic:  Normal speech and language. No gross focal neurologic deficits are appreciated.  Skin:  Skin is warm, dry and intact. No rash noted. Psychiatric: Mood and affect are normal. Speech and behavior are normal.  ____________________________________________   LABS (all labs ordered are listed, but only abnormal results are displayed)  Labs Reviewed - No data to display ____________________________________________  EKG  None ____________________________________________  RADIOLOGY  Left foot complete (view by me, interpreted per Dr. Gwenyth Bender): No acute fracture or dislocation.  Chronic changes as described. ____________________________________________   PROCEDURES  Procedure(s) performed: None  Procedures  Critical Care performed: No  ____________________________________________   INITIAL IMPRESSION / ASSESSMENT AND PLAN / ED COURSE  Pertinent labs & imaging results that were available during my care of the patient were reviewed by me and considered in my medical decision making (see chart for details).  60 year old female presents with nontraumatic left foot pain and mild swelling for the past 3 weeks. Will treat with NSAID's, analgesia, compression wrap and refer to podiatry for outpatient follow-up. Patient has  cane as well as a walker at home which she will use as needed. Strict return precautions given. Patient and spouse verbalize understanding and agree with plan of care.  Clinical Course     ____________________________________________   FINAL CLINICAL IMPRESSION(S) / ED DIAGNOSES  Final diagnoses:  Foot pain, left      NEW MEDICATIONS STARTED DURING THIS VISIT:  New Prescriptions   No medications on file     Note:  This document was prepared using Dragon voice recognition software and may include unintentional dictation errors.    Irean Hong, MD 09/02/15 [id number]

## 2015-09-02 NOTE — Discharge Instructions (Signed)
1. You may take pain medicines as needed (Motrin/Percocet #15). 2. You may remove Ace wrap to bathe and sleep. 3. You may bear weight on the affected foot as you are comfortable. Until then, please use your cane or walker to assist you. 4. Return to the ER for worsening symptoms, increased swelling, numbness/tingling or other concerns.

## 2015-10-17 ENCOUNTER — Encounter: Payer: Self-pay | Admitting: Pharmacist

## 2015-10-17 ENCOUNTER — Other Ambulatory Visit: Payer: Self-pay | Admitting: Pharmacist

## 2015-10-17 NOTE — Patient Outreach (Addendum)
[facility]) Care Management  [facility]   10/17/2015  NABA SNEED 10/23/1955 [id number]  Subjective: Patient presents today for 3 month diabetes follow-up as part of the employer-sponsored Link to Wellness program.  Current diabetes regimen includes metformin.  Patient also continues on daily aspirin, lisinopril and lovastatin.  Most recent MD follow-up was 07/28/15.  Patient has a pending appt for next week with Dr Thedore Mins.  No med changes or major health changes at this time.   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  Mostly boils or bakes her foods.  She does state she is limiting her serving sizes of carbohydrates when she does eat them.    Patient reported exercise habits: States she started walking then she stopped when her foot started swelling which caused her to have walking difficulties. She states that today her swelling has improved but it varies from day to day and sometimes it swells. She reports that she has a lot of standing at work. 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 She states she is elevating her feet everyday to help with the swelling. She does not like the compression stockings.    Patient denies hypoglycemic events. She did have one reading of 68 mg/dL which she states happened when she didn't eat much that day.  Patient reports nocturia 0-1 time per night.  Patient denies pain/burning upon urination.  Patient denies neuropathy. Patient denies visual changes.  Reports last eye exam ~06/2015.  Reports she has pending dental exam this year. Patient reports self foot exams. Denies changes or problems.   Patient reported self  monitored blood glucose frequency daily Home fasting CBG: Range ~100-140 fasting blood glucose Home CBG 30 day average: 115 mg/dL   Objective:  Hemoglobin A1C 07/18/2015 6.9  Vitals:   10/17/15 1012  BP: 110/73  Pulse: 91    Lipid Panel  Cholesterol, Total 07/18/2015 190  Triglyceride 07/18/2015 130  HDL (High Density Lipoprotein) Cho* 07/18/2015 44.0   LDL (Low Density Lipoprotien), Cal* 07/18/2015 120  VLDL Cholesterol 07/18/2015 26 mg/dL  Cholesterol/HDL Ratio 07/18/2015 4.3   10 year ASCVD risk: 5.8%   Encounter Medications: Outpatient Encounter Prescriptions as of 10/17/2015  Medication Sig  . ibuprofen (ADVIL,MOTRIN) 200 MG tablet Take 400 mg by mouth every 6 (six) hours as needed.  Marland Kitchen lisinopril-hydrochlorothiazide (PRINZIDE,ZESTORETIC) 20-12.5 MG per tablet Take 2 tablets by mouth daily.  Marland Kitchen lovastatin (MEVACOR) 10 MG tablet Take 10 mg by mouth at bedtime.  . metFORMIN (GLUCOPHAGE) 500 MG tablet Take 500 mg by mouth daily.  . Travoprost, BAK Free, (TRAVATAN) 0.004 % SOLN ophthalmic solution Place 1 drop into both eyes at bedtime.  . vitamin B-12 (CYANOCOBALAMIN) 1000 MCG tablet Take 1,000 mcg by mouth daily.  . [DISCONTINUED] ibuprofen (ADVIL,MOTRIN) 800 MG tablet Take 1 tablet (800 mg total) by mouth every 8 (eight) hours as needed for moderate pain.  . [DISCONTINUED] naproxen (NAPROSYN) 500 MG tablet Take 1 tablet (500 mg total) by mouth 2 (two) times daily with a meal.  . [DISCONTINUED] oxyCODONE-acetaminophen (ROXICET) 5-325 MG tablet Take 1 tablet by mouth every 4 (four) hours as needed for severe pain.   No facility-administered encounter medications on file as  of 10/17/2015.     Functional Status: In your present state of health, do you have any difficulty performing the following activities: 10/17/2015 04/11/2015  Hearing? N N  Vision? N N  Difficulty concentrating or making decisions? N N  Walking or climbing stairs? N N  Dressing or bathing? N N  Doing  errands, shopping? N N  Some recent data might be hidden    Fall/Depression Screening: PHQ 2/9 Scores 10/17/2015 04/11/2015 08/16/2014 05/03/2014  PHQ - 2 Score 0 0 0 0     Assessment:  Diabetes: Most recent A1C was 6.9% which is at at goal of less than 7%. Weight is stable from last visit with me.  ASCVD risk 5.8% with LDL of 120 mg/dL on low intensity statin, is a candidate for moderate intensity statin based on LDL goal above 100 mg/dL and ASCVD risk.   Lifestyle improvements: Physical Activity- No current exercise regimen.  Was previously walking but stopped due to foot pain/swelling.  Also limited by arthritis in left shoulder  Nutrition- Not currently following low carbohydrate diet    Plan/Goals for Next Visit: -Counseled on signs/symptoms and treatment of hypoglycemia using the rule of 15s -Discussed importance of low carbohydrate diet and exercise of >150 minutes/week -Provided patient with "Planning Healthy Meals" diabetes handout -Patient made goal to start walking for 15 minutes per day for 3 days per week as tolerated with her foot/shoulder pain. -Discussed strategies to minimize foot swelling such as elevation, ice, and compression stockings while working -Will contact patients primary care Dr Thedore MinsSingh to consider increasing Lovastatin to moderate intensity dose of 40 mg daily.   Next appointment to see me is: 3 months (telephonic)   Hazle NordmannKelsy Kaylib Furness, PharmD [facility] PGY2 Pharmacy Resident [phone]  [facility] Problem One   Flowsheet Row Most Recent Value  Care Plan Problem One  Non-adherence to exercise related to diabetes management as evidenced by patient report  Role Documenting the Problem One  Clinical Pharmacist  Care Plan for Problem One  Active  [facility] Long Term Goal (31-90 days)  Patient will work to start walking three days per week for 15 minutes per day in the next 90 days per patient report. t  [facility] Long Term Goal Start Date  10/17/15 [renewed goal]   Interventions for Problem One Long Term Goal  Discussed importance of exercise in diabetes management.  Encouraged patient to begin exercising and work toward 150 minutes per week.  Patient will start by walking 3 days per week for 15 minutes per day as tolerated with her foot swelling    [facility] Problem Two   Flowsheet Row Most Recent Value  Care Plan Problem Two  Wellness  Role Documenting the Problem Two  Clinical Pharmacist  Care Plan for Problem Two  Active  Interventions for Problem Two Long Term Goal   Patient has not seen a dentist in the past 6 months.  Patient reports she had an appointment scheduled in the next couple months.  Discussed importance of seeing a dentist every 6 months.    [facility] Long Term Goal (31-90) days  Patient will schedule a dentist appointment within the next 3 months per patient report.   [facility] Long Term Goal Start Date  07/11/15 [Renewed]

## 2015-10-31 DIAGNOSIS — E7800 Pure hypercholesterolemia, unspecified: Secondary | ICD-10-CM | POA: Diagnosis not present

## 2015-10-31 DIAGNOSIS — M19012 Primary osteoarthritis, left shoulder: Secondary | ICD-10-CM | POA: Diagnosis not present

## 2015-10-31 DIAGNOSIS — E119 Type 2 diabetes mellitus without complications: Secondary | ICD-10-CM | POA: Diagnosis not present

## 2015-10-31 DIAGNOSIS — I10 Essential (primary) hypertension: Secondary | ICD-10-CM | POA: Diagnosis not present

## 2016-01-09 ENCOUNTER — Other Ambulatory Visit: Payer: Self-pay

## 2016-01-09 ENCOUNTER — Ambulatory Visit: Payer: Self-pay

## 2016-01-09 DIAGNOSIS — M19012 Primary osteoarthritis, left shoulder: Secondary | ICD-10-CM | POA: Diagnosis not present

## 2016-01-09 DIAGNOSIS — I10 Essential (primary) hypertension: Secondary | ICD-10-CM | POA: Diagnosis not present

## 2016-01-09 NOTE — Patient Outreach (Addendum)
[facility]) Care Management  [facility]   01/09/2016  Elizabeth RombergCynthia C Bates January 20, 1956 [id number]  Subjective: Patient was called today for 3 month diabetes follow-up phone call as part of the employer-sponsored Link to Wellness program.  Current diabetes regimen includes metformin 500mg  daily.  Patient also continues on daily  Lisinopril/hctz and lovastatin.  She is not on aspirin due to concerns for bleeding with her history of anemia. Most recent MD follow-up was in Sept 2017.  Patient has a pending appt for diabetes next Monday, unable to state doctor's name.  No med changes or major health changes at this time.   Patient reported dietary habits: Eats 2-3 meals/day Breakfast: eggs, bacon, and toast Lunch: depends on cafeteria food at work, sometimes skips Dinner: meat, vegetable, starch  Snacks: not a snacker Drinks: water, sweet tea, regular soda  Patient reported exercise habits: Patient is walking about every day with dog ~1 mile.   Patient denies hypoglycemic events.  Patient denies nocturia; no different than normal.  Patient denies pain/burning upon urination.  Patient denies neuropathy. Patient denies visual changes. Eye appt next Monday. Patient reports self foot exams.   Patient reported self monitored blood glucose frequency every other day. Home fasting CBG: lowest 100s, highest 140s   Patient reported self monitored blood pressure: not elevated, usually < 140/90  Objective:  No results found for: HGBA1C There were no vitals filed for this visit.  10 year ASCVD risk: < 7.5%   Assessment:  Diabetes: Patient was unable to state most recent A1C, however she is most likely controlled based on her reported blood sugar readings.   Cholesterol- At the last visit, it was recommended to her PCP to increase her lovastatin dose to be a moderate intensity statin. Patient states that her provider did not feel that it was necessary at this time due  to her history of poor medication tolerance with higher doses.   Lifestyle improvements:  Physical Activity- Patient has improved her activity level since the last visit. She has started walking her dog daily for ~1 mile, but is limited by arthritic pain. She has an appointment next Monday about this pain, and is hopeful that she will be more active after receiving an injection for her arthritis at this upcoming appointment.   Nutrition- Patient is aware of an appropriate diabetic diet, however continues to drink tea and sodas with full sugar. She states that she is unable to tolerate diet drinks due to GI upset with fake sugars. We discussed cutting back on these drinks, and she stated that she has already reduced their intake but will continue to watch them.    Plan/Goals for Next Visit: - Follow-up with diabetic provider about obtaining an A1c - Continue to increase activity level to walks that are longer than 1 mile several times a week - Continue to monitor blood sugar, take medications as prescribed, and be aware of good diet choices   Allie BossierApryl Manav Pierotti, PharmD PGY1 Pharmacy Resident [phone] (Pager) 01/09/2016 1:05 PM

## 2016-01-16 ENCOUNTER — Ambulatory Visit: Payer: Self-pay

## 2016-01-16 DIAGNOSIS — H40153 Residual stage of open-angle glaucoma, bilateral: Secondary | ICD-10-CM | POA: Diagnosis not present

## 2016-04-16 DIAGNOSIS — E119 Type 2 diabetes mellitus without complications: Secondary | ICD-10-CM | POA: Diagnosis not present

## 2016-04-16 DIAGNOSIS — E7800 Pure hypercholesterolemia, unspecified: Secondary | ICD-10-CM | POA: Diagnosis not present

## 2016-04-16 DIAGNOSIS — I10 Essential (primary) hypertension: Secondary | ICD-10-CM | POA: Diagnosis not present

## 2016-06-04 DIAGNOSIS — H40153 Residual stage of open-angle glaucoma, bilateral: Secondary | ICD-10-CM | POA: Diagnosis not present

## 2016-06-18 DIAGNOSIS — E7800 Pure hypercholesterolemia, unspecified: Secondary | ICD-10-CM | POA: Diagnosis not present

## 2016-06-18 DIAGNOSIS — E119 Type 2 diabetes mellitus without complications: Secondary | ICD-10-CM | POA: Diagnosis not present

## 2016-06-18 DIAGNOSIS — I10 Essential (primary) hypertension: Secondary | ICD-10-CM | POA: Diagnosis not present

## 2016-07-02 ENCOUNTER — Other Ambulatory Visit: Payer: Self-pay | Admitting: Internal Medicine

## 2016-07-02 DIAGNOSIS — Z124 Encounter for screening for malignant neoplasm of cervix: Secondary | ICD-10-CM | POA: Diagnosis not present

## 2016-07-02 DIAGNOSIS — Z23 Encounter for immunization: Secondary | ICD-10-CM | POA: Diagnosis not present

## 2016-07-02 DIAGNOSIS — Z1231 Encounter for screening mammogram for malignant neoplasm of breast: Secondary | ICD-10-CM | POA: Diagnosis not present

## 2016-07-02 DIAGNOSIS — Z780 Asymptomatic menopausal state: Secondary | ICD-10-CM | POA: Diagnosis not present

## 2016-07-02 DIAGNOSIS — Z0000 Encounter for general adult medical examination without abnormal findings: Secondary | ICD-10-CM | POA: Diagnosis not present

## 2016-07-16 ENCOUNTER — Ambulatory Visit
Admission: RE | Admit: 2016-07-16 | Discharge: 2016-07-16 | Disposition: A | Discharge status: Home / Self Care | Payer: 59 | Source: Ambulatory Visit | Attending: Internal Medicine | Admitting: Internal Medicine

## 2016-07-16 DIAGNOSIS — Z1231 Encounter for screening mammogram for malignant neoplasm of breast: Secondary | ICD-10-CM | POA: Diagnosis not present

## 2016-07-16 DIAGNOSIS — Z13820 Encounter for screening for osteoporosis: Secondary | ICD-10-CM | POA: Diagnosis not present

## 2016-07-16 DIAGNOSIS — Z780 Asymptomatic menopausal state: Secondary | ICD-10-CM | POA: Diagnosis not present

## 2016-09-10 DIAGNOSIS — H524 Presbyopia: Secondary | ICD-10-CM | POA: Diagnosis not present

## 2016-12-24 DIAGNOSIS — E119 Type 2 diabetes mellitus without complications: Secondary | ICD-10-CM | POA: Diagnosis not present

## 2016-12-31 DIAGNOSIS — E119 Type 2 diabetes mellitus without complications: Secondary | ICD-10-CM | POA: Diagnosis not present

## 2016-12-31 DIAGNOSIS — I10 Essential (primary) hypertension: Secondary | ICD-10-CM | POA: Diagnosis not present

## 2016-12-31 DIAGNOSIS — E7800 Pure hypercholesterolemia, unspecified: Secondary | ICD-10-CM | POA: Diagnosis not present

## 2017-01-14 DIAGNOSIS — H40153 Residual stage of open-angle glaucoma, bilateral: Secondary | ICD-10-CM | POA: Diagnosis not present

## 2017-05-06 DIAGNOSIS — H40153 Residual stage of open-angle glaucoma, bilateral: Secondary | ICD-10-CM | POA: Diagnosis not present

## 2017-06-17 DIAGNOSIS — E7800 Pure hypercholesterolemia, unspecified: Secondary | ICD-10-CM | POA: Diagnosis not present

## 2017-06-17 DIAGNOSIS — E119 Type 2 diabetes mellitus without complications: Secondary | ICD-10-CM | POA: Diagnosis not present

## 2017-06-17 DIAGNOSIS — I10 Essential (primary) hypertension: Secondary | ICD-10-CM | POA: Diagnosis not present

## 2017-07-01 DIAGNOSIS — E119 Type 2 diabetes mellitus without complications: Secondary | ICD-10-CM | POA: Diagnosis not present

## 2017-07-01 DIAGNOSIS — R87810 Cervical high risk human papillomavirus (HPV) DNA test positive: Secondary | ICD-10-CM | POA: Diagnosis not present

## 2017-07-01 DIAGNOSIS — Z0000 Encounter for general adult medical examination without abnormal findings: Secondary | ICD-10-CM | POA: Diagnosis not present

## 2017-07-01 DIAGNOSIS — I10 Essential (primary) hypertension: Secondary | ICD-10-CM | POA: Diagnosis not present

## 2017-07-03 ENCOUNTER — Other Ambulatory Visit: Payer: Self-pay | Admitting: Internal Medicine

## 2017-07-03 DIAGNOSIS — Z1239 Encounter for other screening for malignant neoplasm of breast: Secondary | ICD-10-CM

## 2017-08-05 DIAGNOSIS — E119 Type 2 diabetes mellitus without complications: Secondary | ICD-10-CM | POA: Diagnosis not present

## 2017-08-05 DIAGNOSIS — Z1231 Encounter for screening mammogram for malignant neoplasm of breast: Secondary | ICD-10-CM | POA: Diagnosis not present

## 2017-08-05 DIAGNOSIS — I10 Essential (primary) hypertension: Secondary | ICD-10-CM | POA: Diagnosis not present

## 2017-08-05 DIAGNOSIS — E538 Deficiency of other specified B group vitamins: Secondary | ICD-10-CM | POA: Diagnosis not present

## 2017-09-10 DIAGNOSIS — H524 Presbyopia: Secondary | ICD-10-CM | POA: Diagnosis not present

## 2017-09-23 ENCOUNTER — Ambulatory Visit
Admission: RE | Admit: 2017-09-23 | Discharge: 2017-09-23 | Disposition: A | Discharge status: Home / Self Care | Payer: 59 | Source: Ambulatory Visit | Attending: Internal Medicine | Admitting: Internal Medicine

## 2017-09-23 DIAGNOSIS — Z1239 Encounter for other screening for malignant neoplasm of breast: Secondary | ICD-10-CM

## 2017-09-23 DIAGNOSIS — Z1231 Encounter for screening mammogram for malignant neoplasm of breast: Secondary | ICD-10-CM | POA: Diagnosis not present

## 2017-12-02 DIAGNOSIS — E538 Deficiency of other specified B group vitamins: Secondary | ICD-10-CM | POA: Diagnosis not present

## 2017-12-02 DIAGNOSIS — E119 Type 2 diabetes mellitus without complications: Secondary | ICD-10-CM | POA: Diagnosis not present

## 2017-12-09 DIAGNOSIS — E7800 Pure hypercholesterolemia, unspecified: Secondary | ICD-10-CM | POA: Diagnosis not present

## 2017-12-09 DIAGNOSIS — E538 Deficiency of other specified B group vitamins: Secondary | ICD-10-CM | POA: Diagnosis not present

## 2017-12-09 DIAGNOSIS — E119 Type 2 diabetes mellitus without complications: Secondary | ICD-10-CM | POA: Diagnosis not present

## 2017-12-09 DIAGNOSIS — I10 Essential (primary) hypertension: Secondary | ICD-10-CM | POA: Diagnosis not present

## 2018-01-20 DIAGNOSIS — H40153 Residual stage of open-angle glaucoma, bilateral: Secondary | ICD-10-CM | POA: Diagnosis not present

## 2018-05-15 DIAGNOSIS — M7989 Other specified soft tissue disorders: Secondary | ICD-10-CM | POA: Diagnosis not present

## 2018-05-15 DIAGNOSIS — L089 Local infection of the skin and subcutaneous tissue, unspecified: Secondary | ICD-10-CM | POA: Diagnosis not present

## 2018-06-06 ENCOUNTER — Other Ambulatory Visit: Payer: Self-pay | Admitting: *Deleted

## 2018-08-15 ENCOUNTER — Other Ambulatory Visit: Payer: Self-pay | Admitting: Internal Medicine

## 2018-08-15 DIAGNOSIS — Z1231 Encounter for screening mammogram for malignant neoplasm of breast: Secondary | ICD-10-CM

## 2018-11-29 IMAGING — MG MM DIGITAL SCREENING BILAT W/ CAD
4 series · 4 of 4 positions shown · non-contrast
Comparison: Previous exam(s).

CLINICAL DATA: Screening.

EXAM:
DIGITAL SCREENING BILATERAL MAMMOGRAM WITH CAD

[L CC]
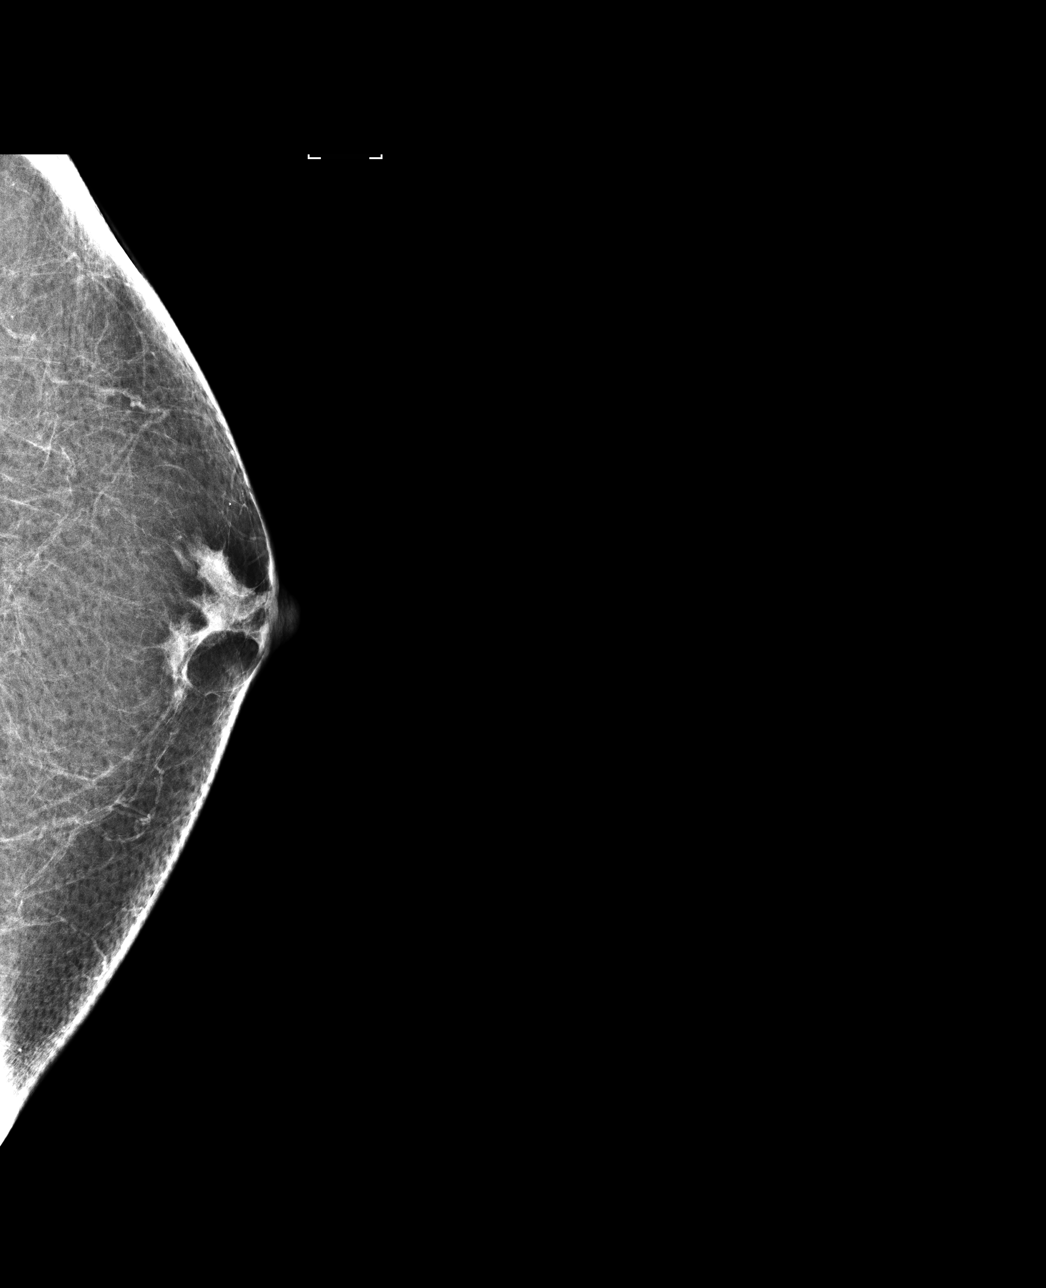

[R CC]
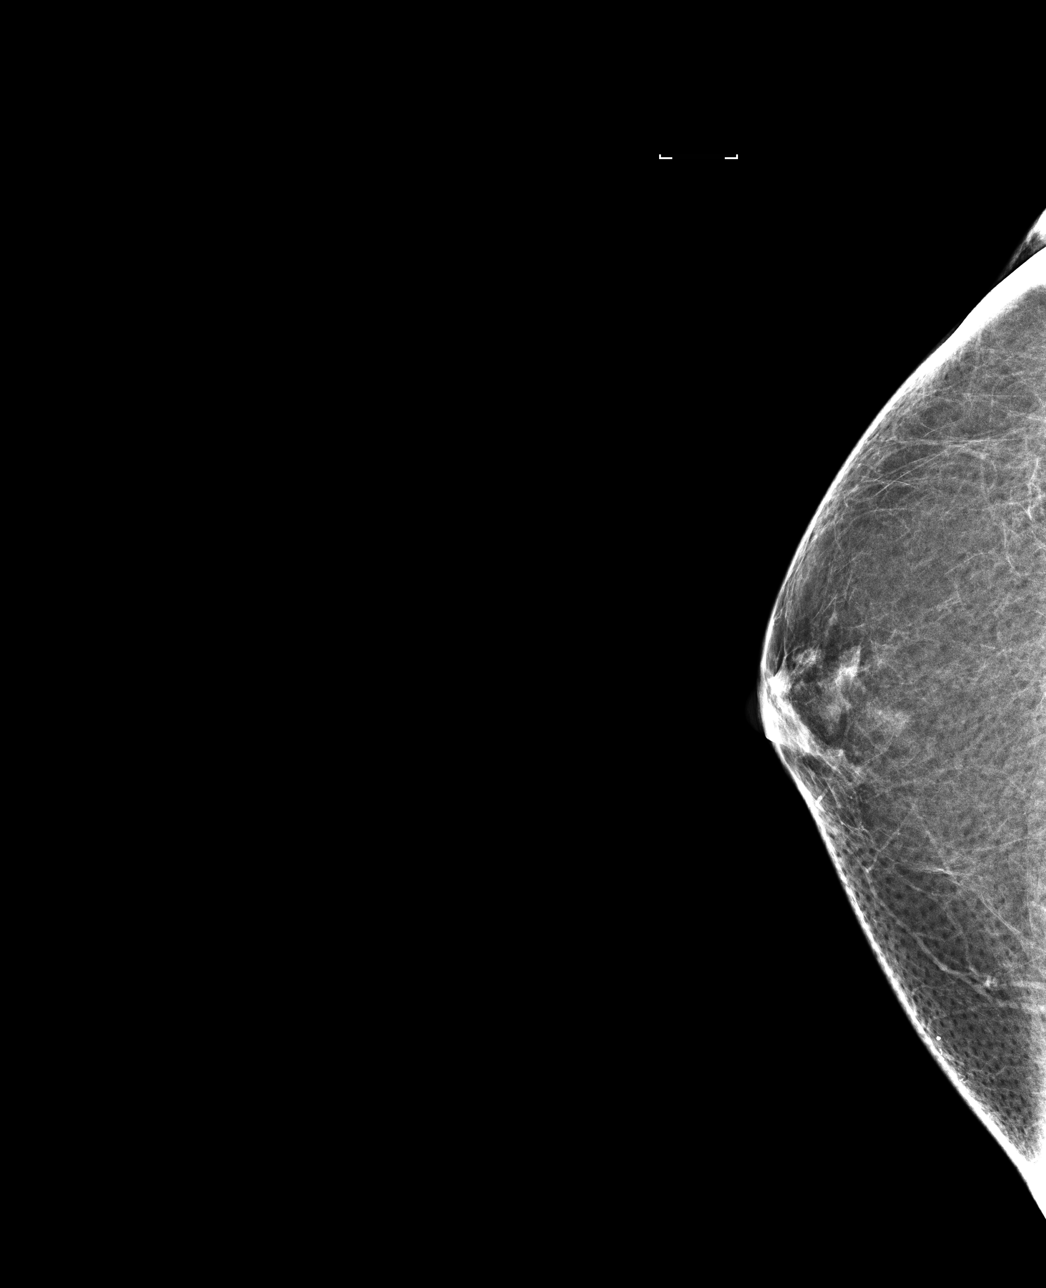

[R MLO]
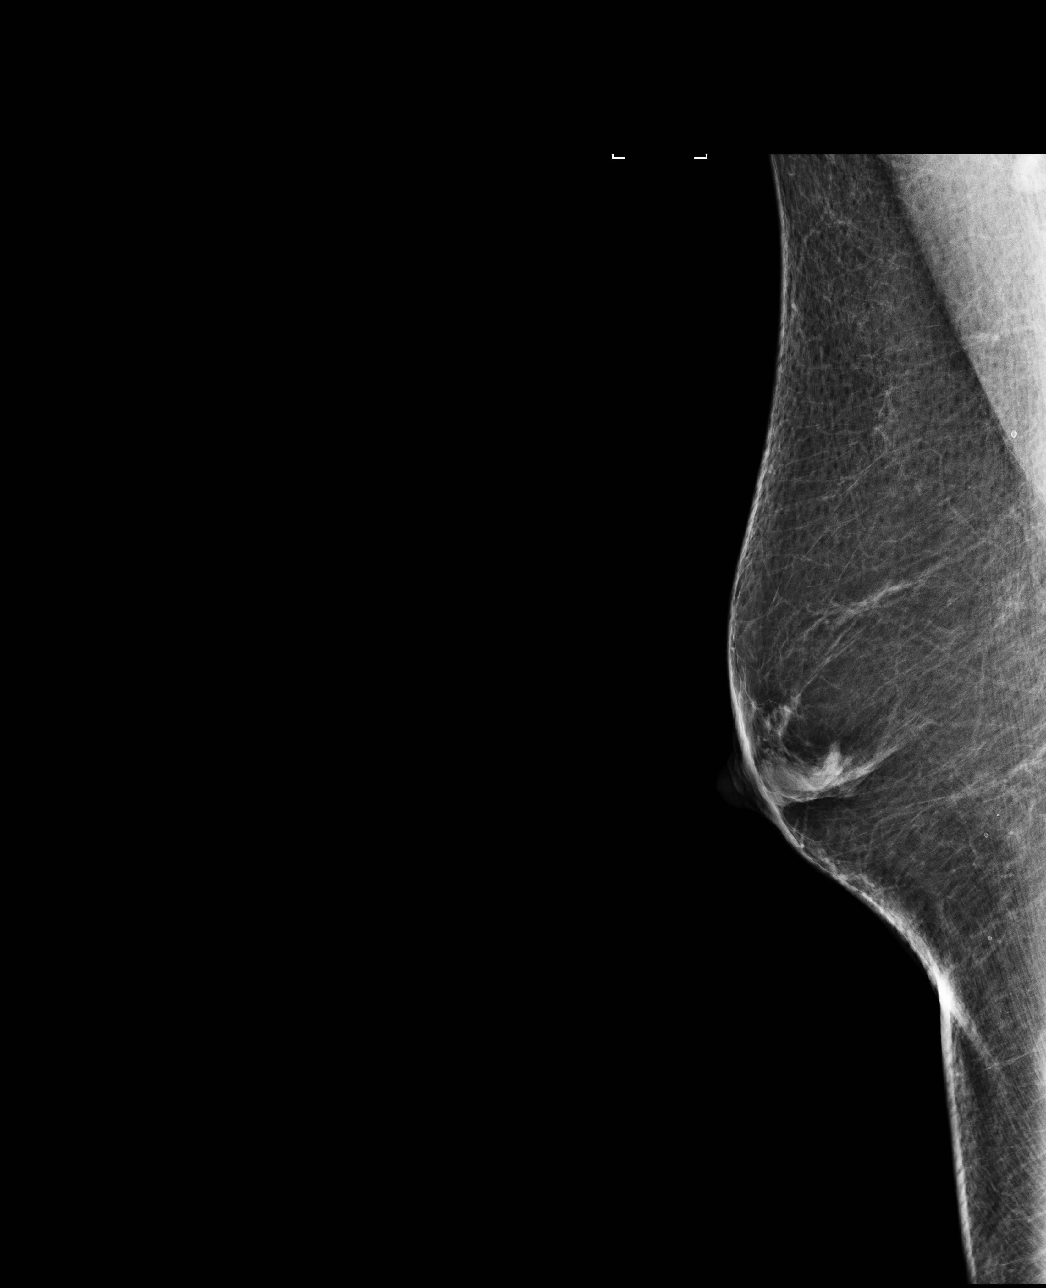

[L MLO]
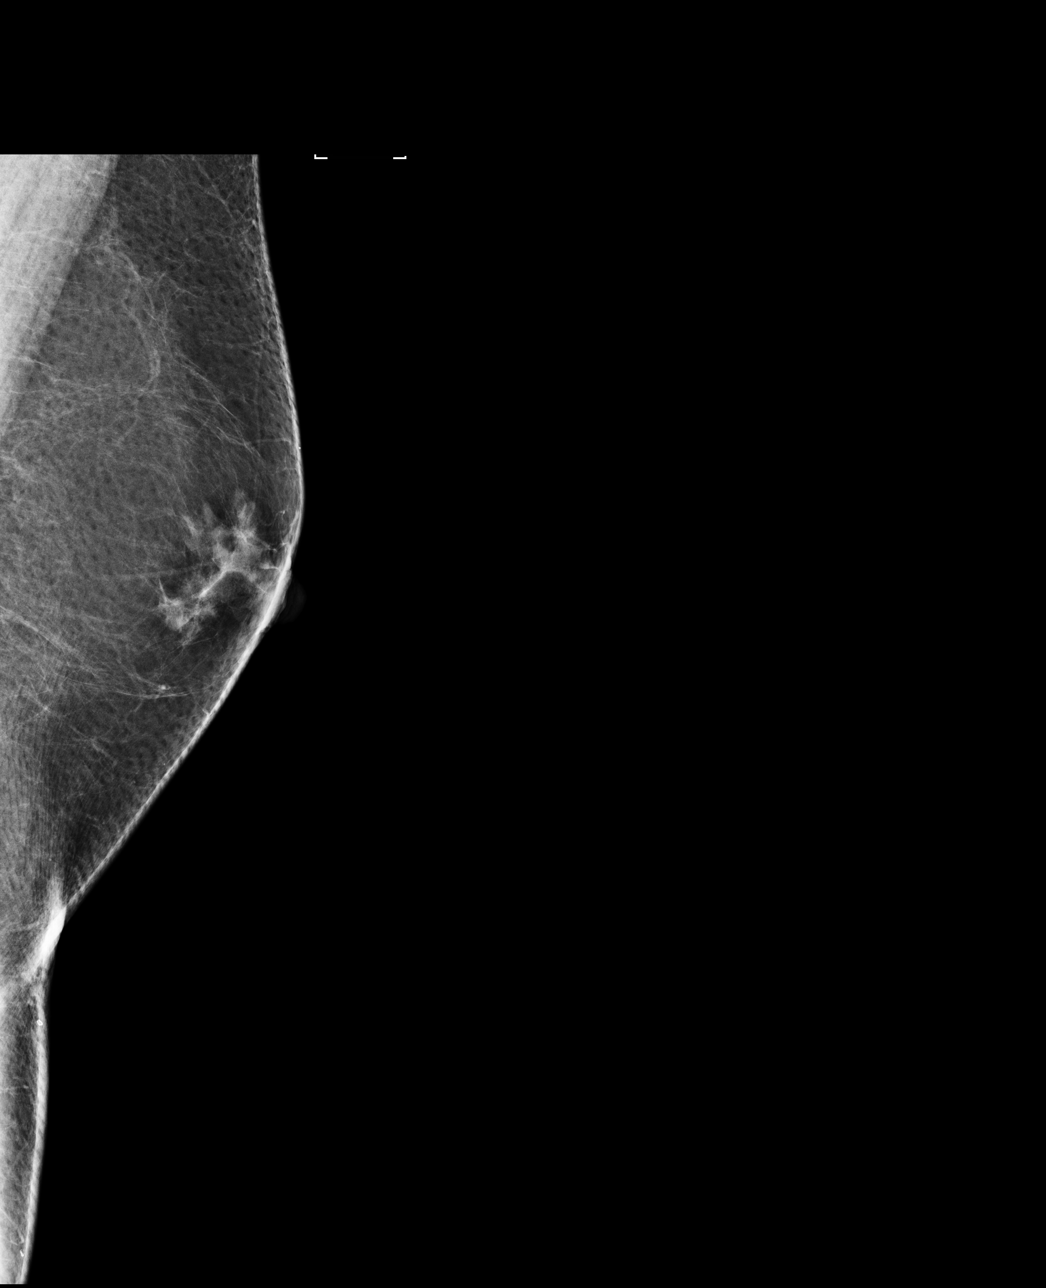

[4 of 4 positions shown; findings below may reference images not displayed]

ACR Breast Density Category b: There are scattered areas of
fibroglandular density.
FINDINGS: There are no findings suspicious for malignancy. Images were
processed with CAD.
IMPRESSION: No mammographic evidence of malignancy. A result letter of this
screening mammogram will be mailed directly to the patient.

RECOMMENDATION:
Screening mammogram in one year. (Code:AS-G-LCT)

BI-RADS CATEGORY  1: Negative.

## 2019-08-18 ENCOUNTER — Other Ambulatory Visit: Payer: Self-pay | Admitting: Internal Medicine

## 2019-08-18 DIAGNOSIS — Z1231 Encounter for screening mammogram for malignant neoplasm of breast: Secondary | ICD-10-CM

## 2019-09-02 ENCOUNTER — Ambulatory Visit: Payer: Self-pay | Attending: Oncology | Admitting: *Deleted

## 2019-09-02 ENCOUNTER — Encounter (INDEPENDENT_AMBULATORY_CARE_PROVIDER_SITE_OTHER): Payer: Self-pay

## 2019-09-02 ENCOUNTER — Other Ambulatory Visit: Payer: Self-pay

## 2019-09-02 ENCOUNTER — Ambulatory Visit
Admission: RE | Admit: 2019-09-02 | Discharge: 2019-09-02 | Disposition: A | Discharge status: Home / Self Care | Payer: Self-pay | Source: Ambulatory Visit | Attending: Oncology | Admitting: Oncology

## 2019-09-02 ENCOUNTER — Encounter: Payer: Self-pay | Admitting: *Deleted

## 2019-09-02 VITALS — BP 184/91 | HR 96 | Temp 97.1°F | Ht 60.75 in | Wt 171.9 lb

## 2019-09-02 DIAGNOSIS — Z0000 Encounter for general adult medical examination without abnormal findings: Secondary | ICD-10-CM

## 2019-09-02 NOTE — Patient Instructions (Signed)
Gave patient hand-out, Women Staying Healthy, Active and Well from [facility], with education on breast health, pap smears, heart and colon health. 

## 2019-09-02 NOTE — Progress Notes (Signed)
Subjective:     Patient ID: Elizabeth Bates, female   DOB: Apr 15, 1955, 64 y.o.   MRN: [id number]  HPI   [facility] Medical History Record - 09/02/19 0912      Breast History   Screening cycle New    CBE Date 09/13/17    Provider (CBE) Delford Field    Initial Mammogram 09/02/19    Last Mammogram Annual    Last Mammogram Date 09/13/17    Provider (Mammogram)  Delford Field    Recent Breast Symptoms None      Breast Cancer History   Breast Cancer History No personal or family history    Comments/Details Mother had ovarian cancer in her late 36's. Maternal Aunt had BC.      Previous History of Breast Problems   Breast Surgery or Biopsy None    Breast Implants N/A    BSE Done See comments   Often     Gynecological/Obstetrical History   Is there any chance that the client could be pregnant?  No    Age at menarche 3    Age at menopause 39    PAP smear history Annually    Date of last PAP  07/01/17    Provider (PAP) [facility]    Age at first live birth n/a    DES Exposure Unkown    Cervical, Uterine or Ovarian cancer No    Family history of Cervial, Uterine or Ovarian cancer Yes   Mom with ovarian cancer in her late 48's, Maternal Greandmother with ovarian cancer in 43's, Maternal Aunt with BC.   Hysterectomy No    Cervix removed No    Ovaries removed No    Laser/Cryosurgery No    Current method of birth control None    Current method of Estrogen/Hormone replacement None    Smoking history None    Comments No ins / 2 in [facility] / $171mo            Review of Systems     Objective:   Physical Exam Chest:     Breasts:        Right: No swelling, bleeding, inverted nipple, mass, nipple discharge, skin change or tenderness.        Left: No swelling, bleeding, inverted nipple, mass, nipple discharge, skin change or tenderness.  Lymphadenopathy:     Upper Body:     Right upper body: No supraclavicular or axillary adenopathy.     Left upper body: No supraclavicular or axillary adenopathy.         Assessment:     63 year old African American female presents to [facility] for clinical breast exam and mammogram.  Clinical breast exam unremarkable.  Taught self breast awareness.  Last pap smear on 07/01/17 was negative / negative.  Next pap due in 2024.  Patient with family history of ovarian cancer in her mother in her 82's, a maternal grandmother with ovarian cancer in her 60's and and a maternal aunt with breast cancer.  Patient did not think her mom had any genetic testing.  Although patients [facility] and [facility] breast cancer risk models did not show evidence of the patient to be at high risk for breast cancer, I feel it is prudent for the patient to have a discussion about genetic testing with a genetic counselor due to her family history of ovarian cancer.  The patient is going to think about it and let me know her decision.  Patient has been screened for eligibility.  She  does not have any insurance, Medicare or Medicaid.  She also meets financial eligibility.   Risk Assessment    Risk Scores      09/02/2019   Last edited by: Alta Corning, CMA   5-year risk: 1.5 %   Lifetime risk: 6.7 %            Plan:     Screening mammogram ordered.  Patient was given my number to call me back with her decision about pursuing genetic testing. Will follow up per [facility] protocol.

## 2019-09-03 ENCOUNTER — Encounter: Payer: Self-pay | Admitting: *Deleted

## 2019-09-03 NOTE — Progress Notes (Signed)
Letter mailed from the [facility] to inform patient of her normal mammogram results.  Patient is to follow-up with annual screening in one year. 

## 2019-09-18 ENCOUNTER — Other Ambulatory Visit: Payer: Self-pay

## 2019-09-18 ENCOUNTER — Emergency Department
Admission: EM | Admit: 2019-09-18 | Discharge: 2019-09-18 | Disposition: A | Discharge status: Home / Self Care | Payer: Self-pay | Attending: Emergency Medicine | Admitting: Emergency Medicine

## 2019-09-18 ENCOUNTER — Encounter: Payer: Self-pay | Admitting: Emergency Medicine

## 2019-09-18 DIAGNOSIS — T161XXA Foreign body in right ear, initial encounter: Secondary | ICD-10-CM | POA: Insufficient documentation

## 2019-09-18 DIAGNOSIS — E119 Type 2 diabetes mellitus without complications: Secondary | ICD-10-CM | POA: Insufficient documentation

## 2019-09-18 DIAGNOSIS — Z7984 Long term (current) use of oral hypoglycemic drugs: Secondary | ICD-10-CM | POA: Insufficient documentation

## 2019-09-18 DIAGNOSIS — W458XXA Other foreign body or object entering through skin, initial encounter: Secondary | ICD-10-CM | POA: Insufficient documentation

## 2019-09-18 DIAGNOSIS — Z79899 Other long term (current) drug therapy: Secondary | ICD-10-CM | POA: Insufficient documentation

## 2019-09-18 DIAGNOSIS — Y9389 Activity, other specified: Secondary | ICD-10-CM | POA: Insufficient documentation

## 2019-09-18 DIAGNOSIS — Y998 Other external cause status: Secondary | ICD-10-CM | POA: Insufficient documentation

## 2019-09-18 DIAGNOSIS — Y9289 Other specified places as the place of occurrence of the external cause: Secondary | ICD-10-CM | POA: Insufficient documentation

## 2019-09-18 DIAGNOSIS — I10 Essential (primary) hypertension: Secondary | ICD-10-CM | POA: Insufficient documentation

## 2019-09-18 NOTE — ED Notes (Signed)
D/c instructions signed on paper and sent to HIM to be scanned into chart

## 2019-09-18 NOTE — ED Provider Notes (Signed)
[facility] Emergency Department Provider Note   ____________________________________________   First MD Initiated Contact with Patient 09/18/19 [phone]     (approximate)  I have reviewed the triage vital signs and the nursing notes.   HISTORY  Chief Complaint Foreign Body in Ear    HPI Elizabeth Bates is a 64 y.o. female patient presents with foreign body right ear.  Patient stated a ball of cotton came off a Q-tip while trying to clean her ear today.  Patient denies pain or hearing loss.         Past Medical History:  Diagnosis Date  . Allergy   . Diabetes mellitus without complication (HCC)   . Hyperlipidemia   . Hypertension   . Vitamin B12 deficiency     Patient Active Problem List   Diagnosis Date Noted  . Controlled type 2 diabetes mellitus without complication, without long-term current use of insulin (HCC) 10/21/2014  . Pure hypercholesterolemia 10/21/2014  . Vitamin B 12 deficiency 10/21/2014  . Anemia due to vitamin B12 deficiency 12/02/2013  . Essential hypertension 10/28/2013    Past Surgical History:  Procedure Laterality Date  . 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 Authorizing Provider  ibuprofen (ADVIL,MOTRIN) 200 MG tablet Take 400 mg by mouth every 6 (six) hours as needed.    [provider]  lisinopril-hydrochlorothiazide (PRINZIDE,ZESTORETIC) 20-12.5 MG per tablet Take 2 tablets by mouth daily.    [provider]  lovastatin (MEVACOR) 10 MG tablet Take 10 mg by mouth at bedtime.    [provider]  metFORMIN (GLUCOPHAGE) 500 MG tablet Take 500 mg by mouth daily.    [provider]  Travoprost, BAK Free, (TRAVATAN) 0.004 % SOLN ophthalmic solution Place 1 drop into both eyes at bedtime.    [provider]  vitamin B-12 (CYANOCOBALAMIN) 1000 MCG tablet Take 1,000  mcg by mouth daily.    [provider]    Allergies Patient has no known allergies.  Family History  Problem Relation Age of Onset  . Cancer Mother   . Hypertension Mother   . Cancer Father   . Hypertension Father   . Cancer Maternal Aunt   . Diabetes Maternal Aunt   . Heart disease Maternal Aunt   . Breast cancer Maternal Aunt   . Cancer Maternal Uncle     Social History Social History   Tobacco Use  . Smoking status: Never Smoker  . Smokeless tobacco: Never Used  Substance Use Topics  . Alcohol use: No    Alcohol/week: 0.0 standard drinks  . Drug use: No    Review of Systems Constitutional: No fever/chills Eyes: No visual changes. ENT: No sore throat.  Foreign body right ear Cardiovascular: Denies chest pain. Respiratory: Denies shortness of breath. Gastrointestinal: No abdominal pain.  No nausea, no vomiting.  No diarrhea.  No constipation. Genitourinary: Negative for dysuria. Musculoskeletal: Negative for back pain. Skin: Negative for rash. Neurological: Negative for headaches, focal weakness or numbness. Endocrine:  Diabetes, hyperlipidemia, hypertension. ____________________________________________   PHYSICAL EXAM:  VITAL SIGNS: ED Triage Vitals  Enc Vitals Group     BP 09/18/19 0939 (!) 169/91     Pulse Rate 09/18/19 0939 100     Resp 09/18/19 0939 19     Temp 09/18/19 0939 98.2 F (36.8 C)     Temp  Source 09/18/19 0939 Oral     SpO2 09/18/19 0939 97 %     Weight 09/18/19 0923 170 lb 13.7 oz (77.5 kg)     Height 09/18/19 0923 5' (1.524 m)     Head Circumference --      Peak Flow --      Pain Score 09/18/19 0923 0     Pain Loc --      Pain Edu? --      Excl. in GC? --     Constitutional: Alert and oriented. Well appearing and in no acute distress. EAR: Cottonball left ear canal.   Cardiovascular: Normal rate, regular rhythm. Grossly normal heart sounds.  Good peripheral circulation.  Elevated blood pressure. Respiratory: Normal  respiratory effort.  No retractions. Lungs CTAB. Skin:  Skin is warm, dry and intact. No rash noted. ____________________________________________   LABS (all labs ordered are listed, but only abnormal results are displayed)  Labs Reviewed - No data to display ____________________________________________  EKG   ____________________________________________  RADIOLOGY  ED MD interpretation:    Official radiology report(s): No results found.  ____________________________________________   PROCEDURES  Procedure(s) performed (including Critical Care):  Procedures   ____________________________________________   INITIAL IMPRESSION / ASSESSMENT AND PLAN / ED COURSE  As part of my medical decision making, I reviewed the following data within the electronic MEDICAL RECORD NUMBER     Patient present foreign body right ear.  See procedure note for removal.  Patient given discharge care instructions.    Elizabeth Bates was evaluated in Emergency Department on 09/18/2019 for the symptoms described in the history of present illness. She was evaluated in the context of the global COVID-19 pandemic, which necessitated consideration that the patient might be at risk for infection with the SARS-CoV-2 virus that causes COVID-19. Institutional protocols and algorithms that pertain to the evaluation of patients at risk for COVID-19 are in a state of rapid change based on information released by regulatory bodies including the CDC and federal and state organizations. These policies and algorithms were followed during the patient's care in the ED.       ____________________________________________   FINAL CLINICAL IMPRESSION(S) / ED DIAGNOSES  Final diagnoses:  Foreign body in auricle of right ear, initial encounter     ED Discharge Orders    None       Note:  This document was prepared using Dragon voice recognition software and may include unintentional dictation errors.      Joni Reining, PA-C 09/18/19 1020    Sharman Cheek, MD 09/18/19 [phone]

## 2019-09-18 NOTE — ED Notes (Signed)
Pt verbalizes understanding of d/c instructions and follow up. 

## 2019-09-18 NOTE — ED Triage Notes (Signed)
Put cotton in right ear today and cannot get it out.

## 2019-09-18 NOTE — ED Notes (Signed)
Pt reports was cleaning her right ear this am and some cotton came off the q-tip and got stuck in her ear. Pt reports not able to remove it but tried.

## 2021-09-14 ENCOUNTER — Other Ambulatory Visit: Payer: Self-pay | Admitting: Internal Medicine

## 2021-09-14 DIAGNOSIS — Z1231 Encounter for screening mammogram for malignant neoplasm of breast: Secondary | ICD-10-CM

## 2022-01-15 IMAGING — MG DIGITAL SCREENING BILAT W/ TOMO W/ CAD
6 of 10 series · 6 of 30 positions shown · non-contrast
Comparison: Previous exam(s).

CLINICAL DATA: Screening.

EXAM:
DIGITAL SCREENING BILATERAL MAMMOGRAM WITH TOMO AND CAD

[L CC synth-2D (1 of 2)]
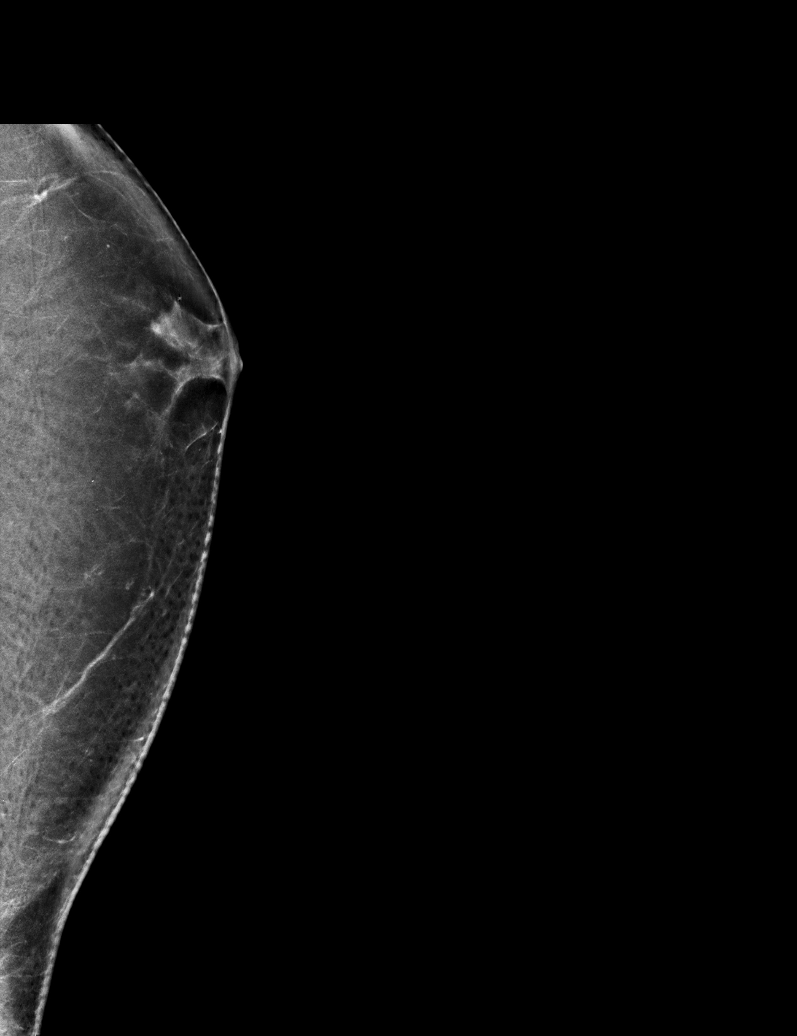

[L CC synth-2D (2 of 2)]
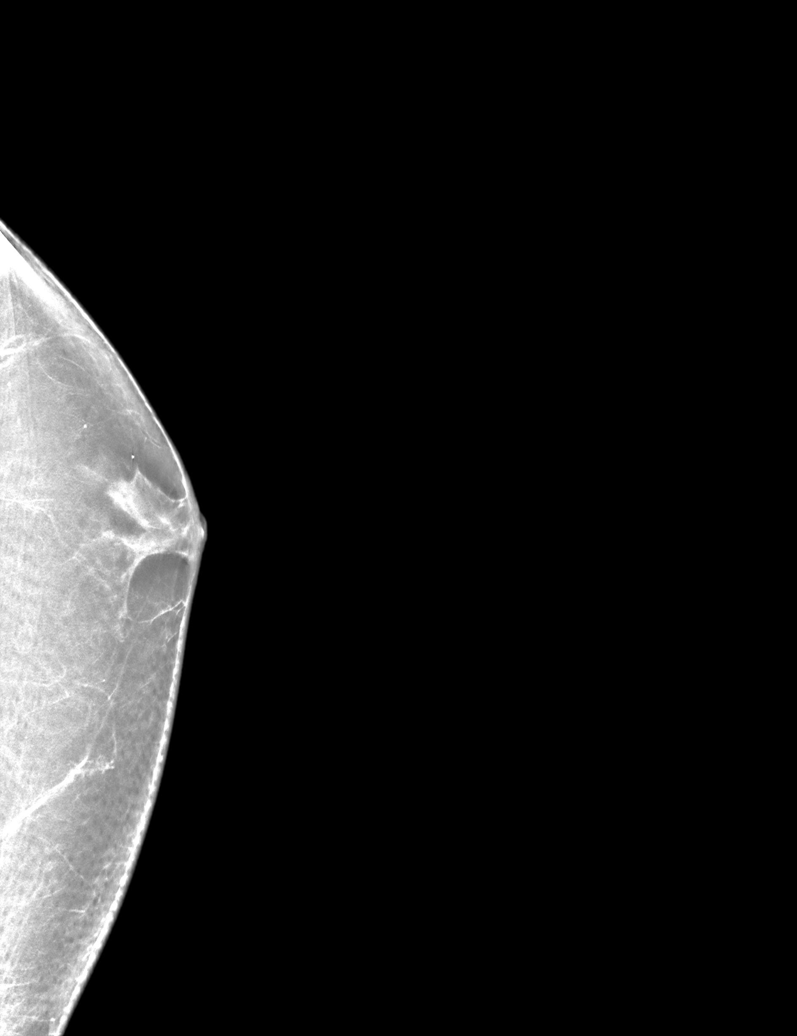

[R CC synth-2D]
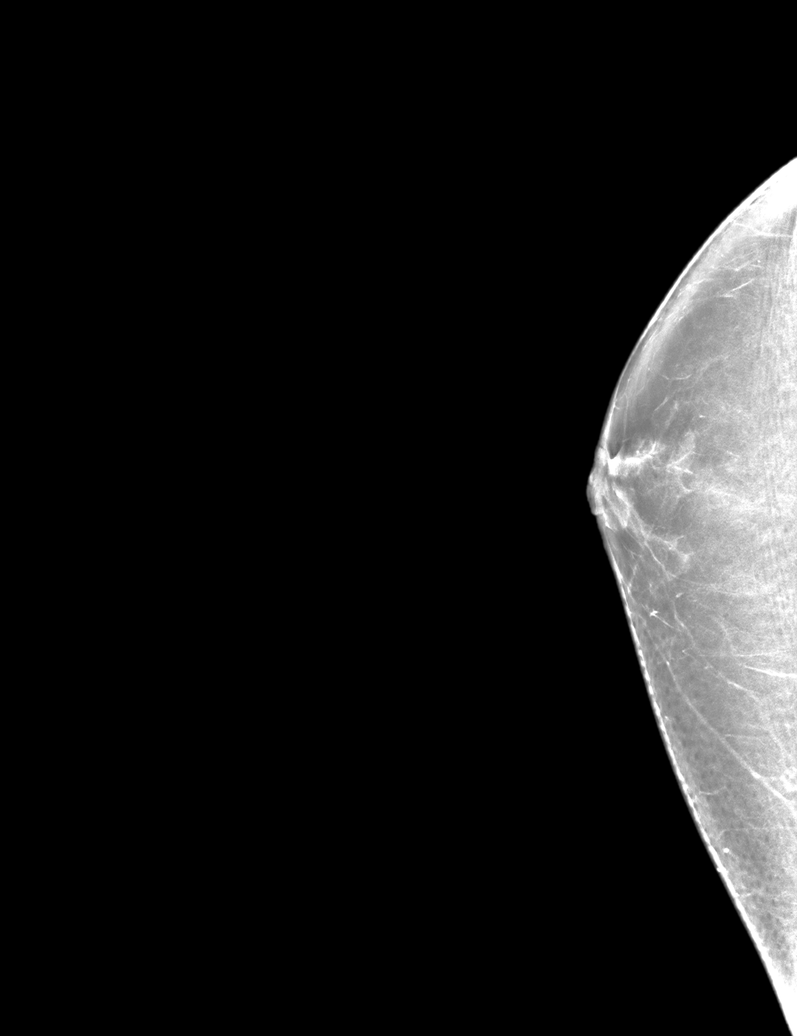

[L MLO synth-2D]
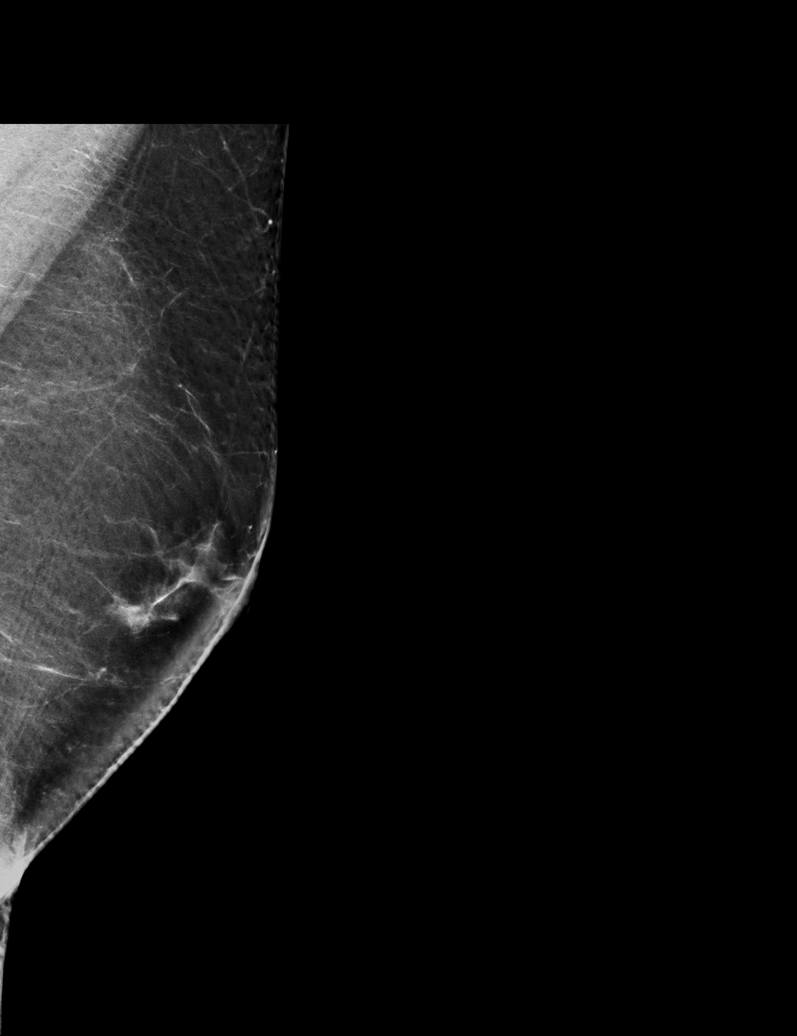

[R MLO synth-2D]
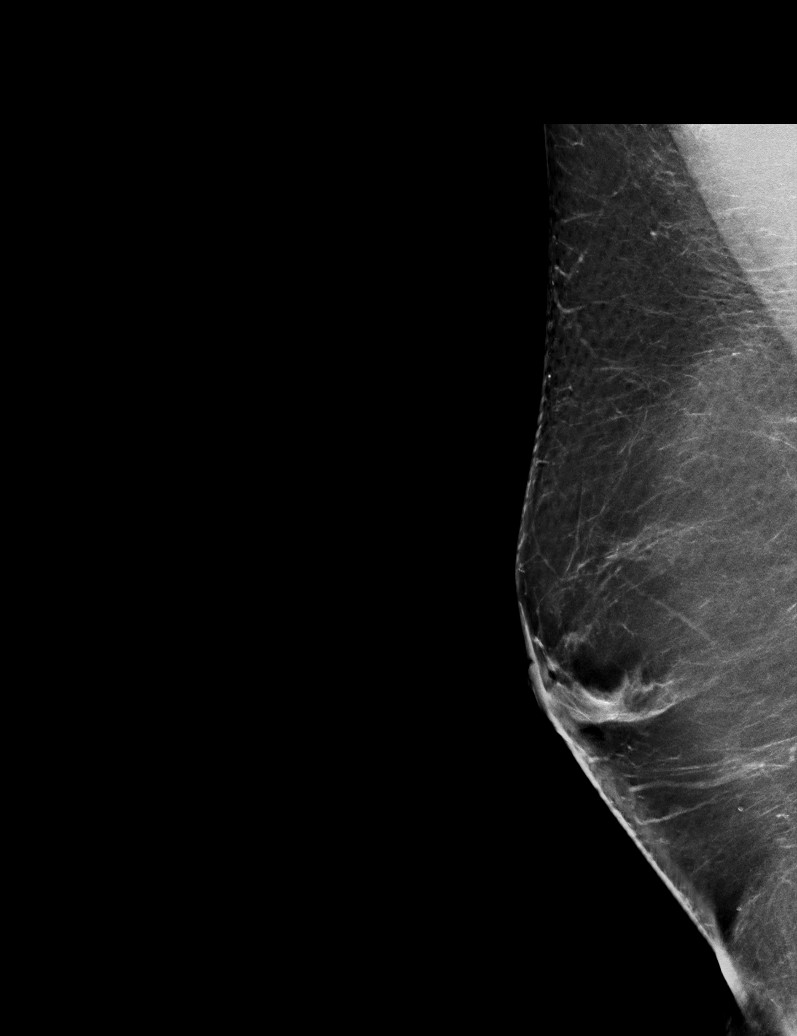

[R CC tomo · tomo slice 34/67.0]
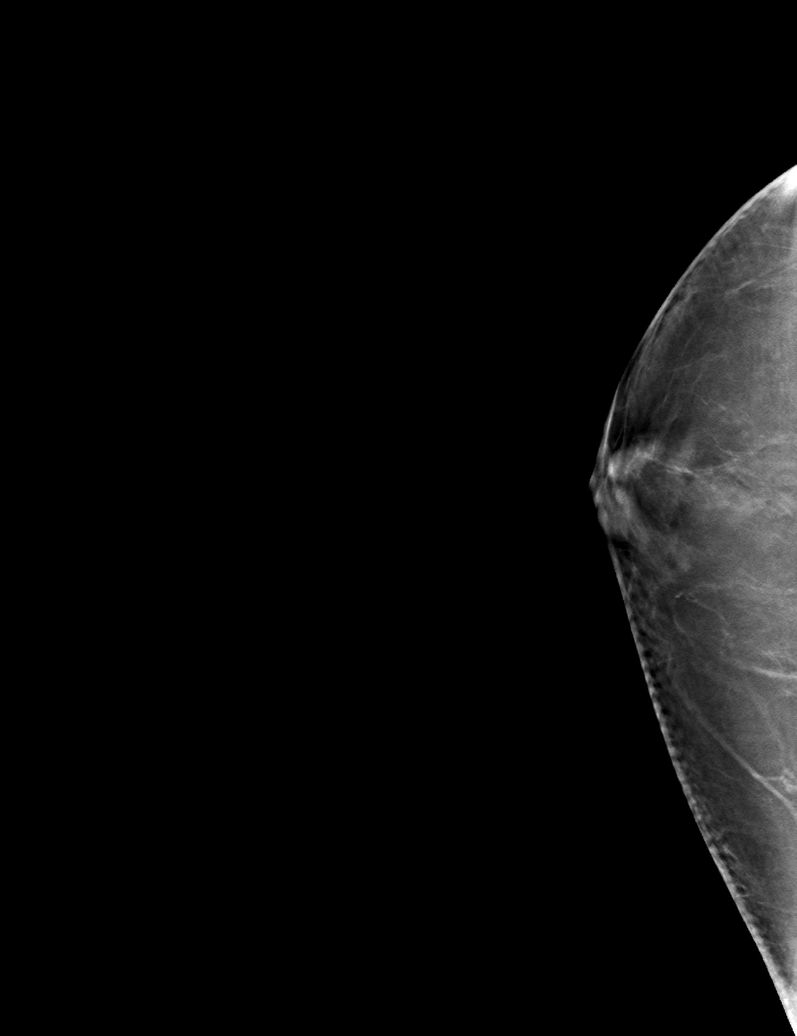

[6 of 30 positions shown; findings below may reference images not displayed]

ACR Breast Density Category b: There are scattered areas of
fibroglandular density.
FINDINGS: There are no findings suspicious for malignancy. Images were
processed with CAD.
IMPRESSION: No mammographic evidence of malignancy. A result letter of this
screening mammogram will be mailed directly to the patient.

RECOMMENDATION:
Screening mammogram in one year. (Code:[id number])

BI-RADS CATEGORY  1: Negative.

## 2022-06-09 ENCOUNTER — Other Ambulatory Visit: Payer: Self-pay

## 2022-06-09 ENCOUNTER — Ambulatory Visit
Admission: RE | Admit: 2022-06-09 | Discharge: 2022-06-09 | Disposition: A | Discharge status: Home / Self Care | Payer: No Typology Code available for payment source | Source: Ambulatory Visit | Attending: Internal Medicine | Admitting: Internal Medicine

## 2022-06-09 DIAGNOSIS — Z1231 Encounter for screening mammogram for malignant neoplasm of breast: Secondary | ICD-10-CM

## 2022-06-09 NOTE — Progress Notes (Signed)
Pt blood pressure is very elevated. Pt given education surrounding BP and need to be seen. Pt stated she had just come from somewhere where she was very upset and had not calmed down yet and that it would come down once she could rest.   No SDOH needs at this time. Pt has PCP.

## 2022-06-14 ENCOUNTER — Encounter: Payer: Self-pay | Admitting: *Deleted

## 2022-06-14 NOTE — Progress Notes (Signed)
Pt attended 06/09/22 screening event where her b/p initially was 173/106 and, on repeat screening, was 215/130. At the event, the pt said she had just come from somewhere else where she had been upset and that once she calmed down and rested, her b/p would improve. At the event, pt confirmed her PCP is Dr. Cora Daniels at [facility] and did not identify any SDOH barriers. Per chart review, pt has last OV with Dr. Marcello Fennel on 04/18/22 (where her b/p was 144/84). During call to pt, pt stated "I am all right now"; when asked if she had been in touch with Dr. Marcello Fennel about her b/p, to see if he had any suggestions for monitoring it further, pt restated "Yes, I am fine now." No additional health equity team support indicated at this time.

## 2023-05-01 DIAGNOSIS — H40051 Ocular hypertension, right eye: Secondary | ICD-10-CM | POA: Diagnosis not present

## 2023-05-15 DIAGNOSIS — H40003 Preglaucoma, unspecified, bilateral: Secondary | ICD-10-CM | POA: Diagnosis not present

## 2023-05-15 DIAGNOSIS — H40059 Ocular hypertension, unspecified eye: Secondary | ICD-10-CM | POA: Diagnosis not present

## 2023-05-22 DIAGNOSIS — F02A3 Dementia in other diseases classified elsewhere, mild, with mood disturbance: Secondary | ICD-10-CM | POA: Diagnosis not present

## 2023-05-22 DIAGNOSIS — G309 Alzheimer's disease, unspecified: Secondary | ICD-10-CM | POA: Diagnosis not present

## 2023-05-22 DIAGNOSIS — Z711 Person with feared health complaint in whom no diagnosis is made: Secondary | ICD-10-CM | POA: Diagnosis not present

## 2023-09-18 DIAGNOSIS — F39 Unspecified mood [affective] disorder: Secondary | ICD-10-CM | POA: Diagnosis not present

## 2023-09-18 DIAGNOSIS — F02A3 Dementia in other diseases classified elsewhere, mild, with mood disturbance: Secondary | ICD-10-CM | POA: Diagnosis not present

## 2023-09-18 DIAGNOSIS — R410 Disorientation, unspecified: Secondary | ICD-10-CM | POA: Diagnosis not present

## 2023-09-18 DIAGNOSIS — G309 Alzheimer's disease, unspecified: Secondary | ICD-10-CM | POA: Diagnosis not present

## 2023-10-02 DIAGNOSIS — H40003 Preglaucoma, unspecified, bilateral: Secondary | ICD-10-CM | POA: Diagnosis not present

## 2023-10-14 DIAGNOSIS — H5203 Hypermetropia, bilateral: Secondary | ICD-10-CM | POA: Diagnosis not present

## 2023-10-14 DIAGNOSIS — H52223 Regular astigmatism, bilateral: Secondary | ICD-10-CM | POA: Diagnosis not present

## 2023-10-14 DIAGNOSIS — H40059 Ocular hypertension, unspecified eye: Secondary | ICD-10-CM | POA: Diagnosis not present

## 2023-10-14 DIAGNOSIS — H40003 Preglaucoma, unspecified, bilateral: Secondary | ICD-10-CM | POA: Diagnosis not present

## 2023-10-14 DIAGNOSIS — H2513 Age-related nuclear cataract, bilateral: Secondary | ICD-10-CM | POA: Diagnosis not present

## 2023-10-14 DIAGNOSIS — R7303 Prediabetes: Secondary | ICD-10-CM | POA: Diagnosis not present

## 2023-10-17 ENCOUNTER — Other Ambulatory Visit: Payer: Self-pay | Admitting: Internal Medicine

## 2023-10-17 DIAGNOSIS — Z1231 Encounter for screening mammogram for malignant neoplasm of breast: Secondary | ICD-10-CM

## 2023-10-23 DIAGNOSIS — Z8669 Personal history of other diseases of the nervous system and sense organs: Secondary | ICD-10-CM | POA: Diagnosis not present

## 2023-10-23 DIAGNOSIS — M19012 Primary osteoarthritis, left shoulder: Secondary | ICD-10-CM | POA: Diagnosis not present

## 2023-10-23 DIAGNOSIS — B952 Enterococcus as the cause of diseases classified elsewhere: Secondary | ICD-10-CM | POA: Diagnosis not present

## 2023-10-23 DIAGNOSIS — I10 Essential (primary) hypertension: Secondary | ICD-10-CM | POA: Diagnosis not present

## 2023-10-23 DIAGNOSIS — N390 Urinary tract infection, site not specified: Secondary | ICD-10-CM | POA: Diagnosis not present

## 2023-10-23 DIAGNOSIS — R8290 Unspecified abnormal findings in urine: Secondary | ICD-10-CM | POA: Diagnosis not present

## 2023-10-23 DIAGNOSIS — E119 Type 2 diabetes mellitus without complications: Secondary | ICD-10-CM | POA: Diagnosis not present

## 2023-10-30 DIAGNOSIS — I10 Essential (primary) hypertension: Secondary | ICD-10-CM | POA: Diagnosis not present

## 2023-10-30 DIAGNOSIS — R413 Other amnesia: Secondary | ICD-10-CM | POA: Diagnosis not present

## 2023-10-30 DIAGNOSIS — Z0000 Encounter for general adult medical examination without abnormal findings: Secondary | ICD-10-CM | POA: Diagnosis not present

## 2023-10-30 DIAGNOSIS — M159 Polyosteoarthritis, unspecified: Secondary | ICD-10-CM | POA: Diagnosis not present

## 2023-10-30 DIAGNOSIS — R635 Abnormal weight gain: Secondary | ICD-10-CM | POA: Diagnosis not present

## 2023-10-30 DIAGNOSIS — Z1331 Encounter for screening for depression: Secondary | ICD-10-CM | POA: Diagnosis not present

## 2023-10-30 DIAGNOSIS — Z6827 Body mass index (BMI) 27.0-27.9, adult: Secondary | ICD-10-CM | POA: Diagnosis not present

## 2023-10-30 DIAGNOSIS — E119 Type 2 diabetes mellitus without complications: Secondary | ICD-10-CM | POA: Diagnosis not present

## 2023-11-08 ENCOUNTER — Encounter

## 2023-12-10 ENCOUNTER — Ambulatory Visit
Admission: RE | Admit: 2023-12-10 | Discharge: 2023-12-10 | Disposition: A | Discharge status: Home / Self Care | Source: Ambulatory Visit | Attending: Internal Medicine | Admitting: Internal Medicine

## 2023-12-10 DIAGNOSIS — Z1231 Encounter for screening mammogram for malignant neoplasm of breast: Secondary | ICD-10-CM | POA: Insufficient documentation
